# Patient Record
Sex: Female | Born: 1990 | Race: Black or African American | Hispanic: No | Marital: Single | Smoking: Former smoker
Health system: Southern US, Community
[De-identification: ages and names within clinical notes are randomized; demographics above are authoritative.]

## PROBLEM LIST (undated history)

## (undated) ENCOUNTER — Inpatient Hospital Stay (HOSPITAL_COMMUNITY): Payer: Self-pay

## (undated) DIAGNOSIS — R87629 Unspecified abnormal cytological findings in specimens from vagina: Secondary | ICD-10-CM

## (undated) DIAGNOSIS — D649 Anemia, unspecified: Secondary | ICD-10-CM

## (undated) HISTORY — PX: DILATION AND CURETTAGE OF UTERUS: SHX78

## (undated) HISTORY — PX: HERNIA REPAIR: SHX51

## (undated) HISTORY — DX: Unspecified abnormal cytological findings in specimens from vagina: R87.629

---

## 2015-05-05 ENCOUNTER — Emergency Department (HOSPITAL_BASED_OUTPATIENT_CLINIC_OR_DEPARTMENT_OTHER)
Admission: EM | Admit: 2015-05-05 | Discharge: 2015-05-05 | Discharge status: Against Medical Advice | Payer: Medicaid Other | Attending: Dermatology | Admitting: Dermatology

## 2015-05-05 ENCOUNTER — Encounter (HOSPITAL_BASED_OUTPATIENT_CLINIC_OR_DEPARTMENT_OTHER): Payer: Self-pay | Admitting: *Deleted

## 2015-05-05 DIAGNOSIS — R1030 Lower abdominal pain, unspecified: Secondary | ICD-10-CM | POA: Insufficient documentation

## 2015-05-05 LAB — URINALYSIS, ROUTINE W REFLEX MICROSCOPIC
Bilirubin Urine: NEGATIVE
GLUCOSE, UA: NEGATIVE mg/dL
Hgb urine dipstick: NEGATIVE
Ketones, ur: NEGATIVE mg/dL
LEUKOCYTES UA: NEGATIVE
NITRITE: NEGATIVE
PH: 6 (ref 5.0–8.0)
Protein, ur: NEGATIVE mg/dL
SPECIFIC GRAVITY, URINE: 1.026 (ref 1.005–1.030)
Urobilinogen, UA: 1 mg/dL (ref 0.0–1.0)

## 2015-05-05 LAB — PREGNANCY, URINE: Preg Test, Ur: NEGATIVE

## 2015-05-05 NOTE — ED Notes (Signed)
Nurse first-pt to reg desk-states she is leaving-steady gait-NAD

## 2015-05-05 NOTE — ED Notes (Signed)
Pt c/o missed period x 1 day and lower abd pain

## 2015-09-02 ENCOUNTER — Encounter (HOSPITAL_BASED_OUTPATIENT_CLINIC_OR_DEPARTMENT_OTHER): Payer: Self-pay | Admitting: Emergency Medicine

## 2015-09-02 ENCOUNTER — Emergency Department (HOSPITAL_BASED_OUTPATIENT_CLINIC_OR_DEPARTMENT_OTHER): Payer: Medicaid Other

## 2015-09-02 ENCOUNTER — Emergency Department (HOSPITAL_BASED_OUTPATIENT_CLINIC_OR_DEPARTMENT_OTHER)
Admission: EM | Admit: 2015-09-02 | Discharge: 2015-09-02 | Disposition: A | Discharge status: Home / Self Care | Payer: Medicaid Other | Attending: Emergency Medicine | Admitting: Emergency Medicine

## 2015-09-02 DIAGNOSIS — W1839XA Other fall on same level, initial encounter: Secondary | ICD-10-CM | POA: Diagnosis not present

## 2015-09-02 DIAGNOSIS — S8992XA Unspecified injury of left lower leg, initial encounter: Secondary | ICD-10-CM | POA: Diagnosis present

## 2015-09-02 DIAGNOSIS — Y9289 Other specified places as the place of occurrence of the external cause: Secondary | ICD-10-CM | POA: Diagnosis not present

## 2015-09-02 DIAGNOSIS — Y998 Other external cause status: Secondary | ICD-10-CM | POA: Insufficient documentation

## 2015-09-02 DIAGNOSIS — Y9389 Activity, other specified: Secondary | ICD-10-CM | POA: Diagnosis not present

## 2015-09-02 DIAGNOSIS — M25562 Pain in left knee: Secondary | ICD-10-CM

## 2015-09-02 MED ORDER — IBUPROFEN 800 MG PO TABS
800.0000 mg | ORAL_TABLET | Freq: Once | ORAL | Status: AC
  Administered 2015-09-02: 800 mg via ORAL
  Filled 2015-09-02: qty 1

## 2015-09-02 MED ORDER — IBUPROFEN 800 MG PO TABS: 800.0000 mg | ORAL_TABLET | Freq: Three times a day (TID) | ORAL | Status: DC | PRN

## 2015-09-02 NOTE — ED Notes (Signed)
MD at bedside. 

## 2015-09-02 NOTE — ED Notes (Signed)
Patient states that she twisted her left knee about 1 hour ago. No deformity noted, patient has some swelling to her Left knee

## 2015-09-02 NOTE — Discharge Instructions (Signed)
Knee Pain °Knee pain is a very common symptom and can have many causes. Knee pain often goes away when you follow your health care provider's instructions for relieving pain and discomfort at home. However, knee pain can develop into a condition that needs treatment. Some conditions may include: °· Arthritis caused by wear and tear (osteoarthritis). °· Arthritis caused by swelling and irritation (rheumatoid arthritis or gout). °· A cyst or growth in your knee. °· An infection in your knee joint. °· An injury that will not heal. °· Damage, swelling, or irritation of the tissues that support your knee (torn ligaments or tendinitis). °If your knee pain continues, additional tests may be ordered to diagnose your condition. Tests may include X-rays or other imaging studies of your knee. You may also need to have fluid removed from your knee. Treatment for ongoing knee pain depends on the cause, but treatment may include: °· Medicines to relieve pain or swelling. °· Steroid injections in your knee. °· Physical therapy. °· Surgery. °HOME CARE INSTRUCTIONS °· Take medicines only as directed by your health care provider. °· Rest your knee and keep it raised (elevated) while you are resting. °· Do not do things that cause or worsen pain. °· Avoid high-impact activities or exercises, such as running, jumping rope, or doing jumping jacks. °· Apply ice to the knee area: °· Put ice in a plastic bag. °· Place a towel between your skin and the bag. °· Leave the ice on for 20 minutes, 2-3 times a day. °· Ask your health care provider if you should wear an elastic knee support. °· Keep a pillow under your knee when you sleep. °· Lose weight if you are overweight. Extra weight can put pressure on your knee. °· Do not use any tobacco products, including cigarettes, chewing tobacco, or electronic cigarettes. If you need help quitting, ask your health care provider. Smoking may slow the healing of any bone and joint problems that you may  have. °SEEK MEDICAL CARE IF: °· Your knee pain continues, changes, or gets worse. °· You have a fever along with knee pain. °· Your knee buckles or locks up. °· Your knee becomes more swollen. °SEEK IMMEDIATE MEDICAL CARE IF:  °· Your knee joint feels hot to the touch. °· You have chest pain or trouble breathing. °  °This information is not intended to replace advice given to you by your health care provider. Make sure you discuss any questions you have with your health care provider. °  °Document Released: 04/16/2007 Document Revised: 07/10/2014 Document Reviewed: 02/02/2014 °Elsevier Interactive Patient Education ©2016 [employer] ° °How to Use a Knee Brace °A knee brace is a device that you wear to support your knee, especially if the knee is healing after an injury or surgery. There are several types of knee braces. Some are designed to prevent an injury (prophylactic brace). These are often worn during sports. Others support an injured knee (functional brace) or keep it still while it heals (rehabilitative brace). People with severe arthritis of the knee may benefit from a brace that takes some pressure off the knee (unloader brace). Most knee braces are made from a combination of cloth and metal or plastic.  °You may need to wear a knee brace to: °· Relieve knee pain. °· Help your knee support your weight (improve stability). °· Help you walk farther (improve mobility). °· Prevent injury. °· Support your knee while it heals from surgery or from an injury. °RISKS AND COMPLICATIONS °  Generally, knee braces are very safe to wear. However, problems may occur, including:  Skin irritation that may lead to infection.  Making your condition worse if you wear the brace in the wrong way. HOW TO USE A KNEE BRACE Different braces will have different instructions for use. Your health care provider will tell you or show you:  How to put on your brace.  How to adjust the brace.  When and how often to wear the  brace.  How to remove the brace.  If you will need any assistive devices in addition to the brace, such as crutches or a cane. In general, your brace should:  Have the hinge of the brace line up with the bend of your knee.  Have straps, hooks, or tapes that fasten snugly around your leg.  Not feel too tight or too loose. HOW TO CARE FOR A KNEE BRACE  Check your brace often for signs of damage, such as loose connections or attachments. Your knee brace may get damaged or wear out during normal use.  Wash the fabric parts of your brace with soap and water.  Read the insert that comes with your brace for other specific care instructions. SEEK MEDICAL CARE IF:  Your knee brace is too loose or too tight and you cannot adjust it.  Your knee brace causes skin redness, swelling, bruising, or irritation.  Your knee brace is not helping.  Your knee brace is making your knee pain worse.   This information is not intended to replace advice given to you by your health care provider. Make sure you discuss any questions you have with your health care provider.   Document Released: 09/09/2003 Document Revised: 03/10/2015 Document Reviewed: 10/12/2014 Elsevier Interactive Patient Education 2016 [employer] RICE for Routine Care of Injuries Theroutine careofmanyinjuriesincludes rest, ice, compression, and elevation (RICE therapy). RICE therapy is often recommended for injuries to soft tissues, such as a muscle strain, ligament injuries, bruises, and overuse injuries. It can also be used for some bony injuries. Using RICE therapy can help to relieve pain, lessen swelling, and enable your body to heal. Rest Rest is required to allow your body to heal. This usually involves reducing your normal activities and avoiding use of the injured part of your body. Generally, you can return to your normal activities when you are comfortable and have been given permission by your health care  provider. Ice Icing your injury helps to keep the swelling down, and it lessens pain. Do not apply ice directly to your skin.  Put ice in a plastic bag.  Place a towel between your skin and the bag.  Leave the ice on for 20 minutes, 2-3 times a day. Do this for as long as you are directed by your health care provider. Compression Compression means putting pressure on the injured area. Compression helps to keep swelling down, gives support, and helps with discomfort. Compression may be done with an elastic bandage. If an elastic bandage has been applied, follow these general tips:  Remove and reapply the bandage every 3-4 hours or as directed by your health care provider.  Make sure the bandage is not wrapped too tightly, because this can cut off circulation. If part of your body beyond the bandage becomes blue, numb, cold, swollen, or more painful, your bandage is most likely too tight. If this occurs, remove your bandage and reapply it more loosely.  See your health care provider if the bandage seems to be making your  problems worse rather than better. Elevation Elevation means keeping the injured area raised. This helps to lessen swelling and decrease pain. If possible, your injured area should be elevated at or above the level of your heart or the center of your chest. WHEN SHOULD I SEEK MEDICAL CARE? You should seek medical care if:  Your pain and swelling continue.  Your symptoms are getting worse rather than improving. These symptoms may indicate that further evaluation or further X-rays are needed. Sometimes, X-rays may not show a small broken bone (fracture) until a number of days later. Make a follow-up appointment with your health care provider. WHEN SHOULD I SEEK IMMEDIATE MEDICAL CARE? You should seek immediate medical care if:  You have sudden severe pain at or below the area of your injury.  You have redness or increased swelling around your injury.  You have tingling  or numbness at or below the area of your injury that does not improve after you remove the elastic bandage.   This information is not intended to replace advice given to you by your health care provider. Make sure you discuss any questions you have with your health care provider.   Document Released: 10/01/2000 Document Revised: 03/10/2015 Document Reviewed: 05/27/2014 Elsevier Interactive Patient Education [employer]

## 2015-09-02 NOTE — ED Provider Notes (Signed)
TIME SEEN: 1:30 AM  CHIEF COMPLAINT: Left knee pain  HPI: Pt is a 25 y.o. female with no significant past medical history who presents to the emergency department with left knee pain. States that she bent over to plug in her phone charger and twisted her left knee. States that she has noticed some mild swelling since. States pain is worse with full extension and bearing weight. States that her knee buckled causing her to fall to the ground she did not strike her head. Denies neck or back pain, chest or abdominal pain. No numbness, tingling or focal weakness. Has never had problem with this knee before. No history of intra-articular injections or knee surgery. No fever. No history of gout.  ROS: See HPI Constitutional: no fever  Eyes: no drainage  ENT: no runny nose   Cardiovascular:  no chest pain  Resp: no SOB  GI: no vomiting GU: no dysuria Integumentary: no rash  Allergy: no hives  Musculoskeletal: no leg swelling  Neurological: no slurred speech ROS otherwise negative  PAST MEDICAL HISTORY/PAST SURGICAL HISTORY:  History reviewed. No pertinent past medical history.  MEDICATIONS:  Prior to Admission medications   Not on File    ALLERGIES:  No Known Allergies  SOCIAL HISTORY:  Social History  Substance Use Topics  . Smoking status: Never Smoker   . Smokeless tobacco: Not on file  . Alcohol Use: No    FAMILY HISTORY: History reviewed. No pertinent family history.  EXAM: BP 135/78 mmHg  Pulse 85  Temp(Src) 99.5 F (37.5 C) (Oral)  Resp 18  Ht  (1.727 m)  Wt 212 lb (96.163 kg)  BMI 32.24 kg/m2  SpO2 100%  LMP 08/14/2015 CONSTITUTIONAL: Alert and oriented and responds appropriately to questions. Well-appearing; well-nourished HEAD: Normocephalic, atraumatic EYES: Conjunctivae clear, PERRL ENT: normal nose; no rhinorrhea; moist mucous membranes NECK: Supple, no meningismus, no LAD, no midline spinal tenderness or step-off or deformity  CARD: RRR; S1 and S2  appreciated; no murmurs, no clicks, no rubs, no gallops CHEST:  Nontender to palpation without crepitus, ecchymosis or deformity RESP: Normal chest excursion without splinting or tachypnea; breath sounds clear and equal bilaterally; no wheezes, no rhonchi, no rales, no hypoxia or respiratory distress, speaking full sentences ABD/GI: Normal bowel sounds; non-distended; soft, non-tender, no rebound, no guarding, no peritoneal signs BACK:  The back appears normal and is non-tender to palpation, there is no CVA tenderness, no midline spinal tenderness or step-off or deformity EXT: Tender to palpation over the anterior knee with flexion only. She does appear to have some suprapatellar swelling but appears to intact patellar tendon. She does have full range of motion in this knee but does report increased pain with full extension. No ligamentous laxity appreciated. No significant joint effusion. No erythema or warmth. 2+ DP pulse on the left side. No tenderness over the left foot or left ankle or left hip. Normal ROM in all joints; otherwise extremity is are non-tender to palpation; no edema; normal capillary refill; no cyanosis, no calf tenderness or swelling    SKIN: Normal color for age and race; warm; no rash NEURO: Moves all extremities equally, sensation to light touch intact diffusely, cranial nerves II through XII intact PSYCH: The patient's mood and manner are appropriate. Grooming and personal hygiene are appropriate.  MEDICAL DECISION MAKING: Patient here with knee pain. Suspect knee sprain. No ligamentous laxity to suggest anterior cruciate ligament injury. No signs of septic arthritis or gout. No signs of DVT. Neurovascularly intact  distally. X-ray shows no fracture, dislocation or effusion. Have recommended medical management with elevation, ice, rest. We'll provide crutches for pain relief with ambulating. Reports ibuprofen has helped her pain significantly. We'll discharge with prescription for  the same. Have advised her symptoms do not improve in 1 week despite medical management and that she should follow-up with an orthopedic, sports medicine physician. Have provided her with outpatient resources. Discussed return precautions. She verbalized understanding and is comfortable with this plan. We'll place a knee sleeve for comfort.       Layla Maw Ward, DO 09/02/15 [id number]

## 2016-04-02 ENCOUNTER — Encounter (HOSPITAL_COMMUNITY): Payer: Self-pay | Admitting: Emergency Medicine

## 2016-04-02 ENCOUNTER — Emergency Department (HOSPITAL_COMMUNITY)
Admission: EM | Admit: 2016-04-02 | Discharge: 2016-04-03 | Disposition: A | Discharge status: Home / Self Care | Payer: Medicaid Other | Attending: Emergency Medicine | Admitting: Emergency Medicine

## 2016-04-02 DIAGNOSIS — R1032 Left lower quadrant pain: Secondary | ICD-10-CM | POA: Insufficient documentation

## 2016-04-02 DIAGNOSIS — F17200 Nicotine dependence, unspecified, uncomplicated: Secondary | ICD-10-CM | POA: Insufficient documentation

## 2016-04-02 LAB — URINALYSIS, ROUTINE W REFLEX MICROSCOPIC
BILIRUBIN URINE: NEGATIVE
Glucose, UA: NEGATIVE mg/dL
Hgb urine dipstick: NEGATIVE
Ketones, ur: 15 mg/dL — AB
Leukocytes, UA: NEGATIVE
NITRITE: NEGATIVE
PH: 6.5 (ref 5.0–8.0)
Protein, ur: NEGATIVE mg/dL
SPECIFIC GRAVITY, URINE: 1.02 (ref 1.005–1.030)

## 2016-04-02 LAB — CBC WITH DIFFERENTIAL/PLATELET
BASOS ABS: 0 10*3/uL (ref 0.0–0.1)
BASOS PCT: 0 %
EOS ABS: 0.1 10*3/uL (ref 0.0–0.7)
EOS PCT: 2 %
HCT: 38.3 % (ref 36.0–46.0)
Hemoglobin: 12.2 g/dL (ref 12.0–15.0)
Lymphocytes Relative: 49 %
Lymphs Abs: 2.6 10*3/uL (ref 0.7–4.0)
MCH: 25.8 pg — ABNORMAL LOW (ref 26.0–34.0)
MCHC: 31.9 g/dL (ref 30.0–36.0)
MCV: 81 fL (ref 78.0–100.0)
Monocytes Absolute: 0.4 10*3/uL (ref 0.1–1.0)
Monocytes Relative: 6 %
Neutro Abs: 2.3 10*3/uL (ref 1.7–7.7)
Neutrophils Relative %: 43 %
PLATELETS: 236 10*3/uL (ref 150–400)
RBC: 4.73 MIL/uL (ref 3.87–5.11)
RDW: 14.1 % (ref 11.5–15.5)
WBC: 5.4 10*3/uL (ref 4.0–10.5)

## 2016-04-02 LAB — COMPREHENSIVE METABOLIC PANEL
ALT: 13 U/L — AB (ref 14–54)
AST: 15 U/L (ref 15–41)
Albumin: 3.8 g/dL (ref 3.5–5.0)
Alkaline Phosphatase: 41 U/L (ref 38–126)
Anion gap: 9 (ref 5–15)
BUN: 11 mg/dL (ref 6–20)
CHLORIDE: 106 mmol/L (ref 101–111)
CO2: 23 mmol/L (ref 22–32)
CREATININE: 0.96 mg/dL (ref 0.44–1.00)
Calcium: 9.3 mg/dL (ref 8.9–10.3)
GFR calc non Af Amer: >60 mL/min (ref >60)
Glucose, Bld: 97 mg/dL (ref 65–99)
Potassium: 3.6 mmol/L (ref 3.5–5.1)
SODIUM: 138 mmol/L (ref 135–145)
Total Bilirubin: 0.5 mg/dL (ref 0.3–1.2)
Total Protein: 7.1 g/dL (ref 6.5–8.1)

## 2016-04-02 LAB — I-STAT BETA HCG BLOOD, ED (MC, WL, AP ONLY): I-stat hCG, quantitative: <5 m[IU]/mL (ref <5)

## 2016-04-02 NOTE — ED Triage Notes (Signed)
Pt. Reports intermittent LLQ abdominal pain onset 5 days ago , denies nausea ,vomitting or diarrhea , no urinary discomfort or fever .

## 2016-04-02 NOTE — ED Provider Notes (Signed)
[facility]-EMERGENCY DEPT Provider Note   CSN: [id number] Arrival date & time: 04/02/16  2156     History   Chief Complaint Chief Complaint  Patient presents with  . Abdominal Pain    HPI Patty Atkins is a 25 y.o. female.  HPI   25 year old female presents for evaluation of abdominal pain. Patient reports for the past 4 days she has had intermittent left lower quadrant abdominal pain. She described the pain as a cramping contraction-like sensation, mild to moderate in severity lasting for several seconds and is waxing and waning throughout the day. Nothing seems to brought on the pain and no specific treatment tried. She denies having any active pain at this time. She denies having fever, chills, night sweats, chest pain, shortness of breath, nausea, vomiting, diarrhea, constipation, dysuria, vaginal bleeding, vaginal discharge, or rash. She denies any recent strenuous activities or heavy lifting. No history of kidney stones and no history of STD. She denies any new sexual partners. Her last menstrual period was September 4 and she is regular.    History reviewed. No pertinent past medical history.  There are no active problems to display for this patient.   History reviewed. No pertinent surgical history.  OB History    No data available       Home Medications    Prior to Admission medications   Medication Sig Start Date End Date Taking? Authorizing Provider  ibuprofen (ADVIL,MOTRIN) 800 MG tablet Take 1 tablet (800 mg total) by mouth every 8 (eight) hours as needed for mild pain. 09/02/15   Layla Maw Ward, DO    Family History No family history on file.  Social History Social History  Substance Use Topics  . Smoking status: Current Every Day Smoker  . Smokeless tobacco: Never Used  . Alcohol use No     Allergies   Review of patient's allergies indicates no known allergies.   Review of Systems Review of Systems  All other systems reviewed and are  negative.    Physical Exam Updated Vital Signs BP 120/80 (BP Location: Left Arm)   Pulse 72   Temp 98.2 F (36.8 C) (Oral)   Resp 16   Ht 5\' 9"  (1.753 m)   Wt 90.7 kg   LMP 03/06/2016   SpO2 100%   BMI 29.53 kg/m   Physical Exam  Constitutional: She appears well-developed and well-nourished. No distress.  HENT:  Head: Atraumatic.  Eyes: Conjunctivae are normal.  Neck: Neck supple.  Cardiovascular: Normal rate and regular rhythm.   Pulmonary/Chest: Effort normal and breath sounds normal.  Abdominal: Soft. Bowel sounds are normal. She exhibits no distension. There is no tenderness.  Genitourinary:  Genitourinary Comments: No CVA tenderness  Neurological: She is alert.  Skin: No rash noted.  Psychiatric: She has a normal mood and affect.  Nursing note and vitals reviewed.    ED Treatments / Results  Labs (all labs ordered are listed, but only abnormal results are displayed) Labs Reviewed  CBC WITH DIFFERENTIAL/PLATELET - Abnormal; Notable for the following:       Result Value   MCH 25.8 (*)    All other components within normal limits  COMPREHENSIVE METABOLIC PANEL - Abnormal; Notable for the following:    ALT 13 (*)    All other components within normal limits  URINALYSIS, ROUTINE W REFLEX MICROSCOPIC (NOT AT [facility]) - Abnormal; Notable for the following:    Ketones, ur 15 (*)    All other components within normal limits  I-STAT BETA HCG BLOOD, ED ([facility], [facility], [facility] ONLY)    EKG  EKG Interpretation None       Radiology No results found.  Procedures Procedures (including critical care time)  Medications Ordered in ED Medications - No data to display   Initial Impression / Assessment and Plan / ED Course  I have reviewed the triage vital signs and the nursing notes.  Pertinent labs & imaging results that were available during my care of the patient were reviewed by me and considered in my medical decision making (see chart for details).  Clinical Course     BP 124/70 (BP Location: Right Arm)   Pulse 94   Temp 98.2 F (36.8 C) (Oral)   Resp 17   Ht 5\' 9"  (1.753 m)   Wt 90.7 kg   LMP 03/06/2016   SpO2 100%   BMI 29.53 kg/m    Final Clinical Impressions(s) / ED Diagnoses   Final diagnoses:  LLQ pain    New Prescriptions New Prescriptions   No medications on file   12:08 AM Patient presents with left lower quadrant abdominal pain. No change in bowel bladder habits. Abdomen is soft nontender on exam. Her labs are reassuring. Pregnancy test is negative, and UA without evidence of UTI. She denies any new sexual partners and states she have low suspicion for STD. I did discuss option of having a pelvic examination the patient declined. At this time I have low suspicion for any acute emergent medical condition. Patient is stable for discharge. Return precaution discussed.    Fayrene HelperBowie Malaisha Silliman, PA-C 04/03/16 0010    Derwood KaplanAnkit Nanavati, MD 04/03/16 [id number]

## 2016-07-29 ENCOUNTER — Emergency Department (HOSPITAL_COMMUNITY)
Admission: EM | Admit: 2016-07-29 | Discharge: 2016-07-30 | Disposition: A | Discharge status: Home / Self Care | Payer: Medicaid Other | Attending: Emergency Medicine | Admitting: Emergency Medicine

## 2016-07-29 ENCOUNTER — Encounter (HOSPITAL_COMMUNITY): Payer: Self-pay | Admitting: Emergency Medicine

## 2016-07-29 DIAGNOSIS — Z3A08 8 weeks gestation of pregnancy: Secondary | ICD-10-CM | POA: Diagnosis not present

## 2016-07-29 DIAGNOSIS — Z87891 Personal history of nicotine dependence: Secondary | ICD-10-CM | POA: Diagnosis not present

## 2016-07-29 DIAGNOSIS — O26891 Other specified pregnancy related conditions, first trimester: Secondary | ICD-10-CM | POA: Insufficient documentation

## 2016-07-29 DIAGNOSIS — N898 Other specified noninflammatory disorders of vagina: Secondary | ICD-10-CM | POA: Insufficient documentation

## 2016-07-29 DIAGNOSIS — O210 Mild hyperemesis gravidarum: Secondary | ICD-10-CM | POA: Diagnosis present

## 2016-07-29 DIAGNOSIS — Z79899 Other long term (current) drug therapy: Secondary | ICD-10-CM | POA: Diagnosis not present

## 2016-07-29 DIAGNOSIS — R112 Nausea with vomiting, unspecified: Secondary | ICD-10-CM

## 2016-07-29 LAB — URINALYSIS, ROUTINE W REFLEX MICROSCOPIC
Bilirubin Urine: NEGATIVE
GLUCOSE, UA: NEGATIVE mg/dL
HGB URINE DIPSTICK: NEGATIVE
Ketones, ur: NEGATIVE mg/dL
LEUKOCYTES UA: NEGATIVE
NITRITE: NEGATIVE
Protein, ur: NEGATIVE mg/dL
SPECIFIC GRAVITY, URINE: 1.026 (ref 1.005–1.030)
pH: 6 (ref 5.0–8.0)

## 2016-07-29 MED ORDER — METOCLOPRAMIDE HCL 5 MG/ML IJ SOLN
10.0000 mg | Freq: Once | INTRAMUSCULAR | Status: AC
  Administered 2016-07-29: 10 mg via INTRAMUSCULAR
  Filled 2016-07-29: qty 2

## 2016-07-29 MED ORDER — SODIUM CHLORIDE 0.9 % IV BOLUS (SEPSIS)
1000.0000 mL | INTRAVENOUS | Status: AC
Start: 2016-07-29 — End: 2016-07-30
  Administered 2016-07-30: 1000 mL via INTRAVENOUS

## 2016-07-29 NOTE — ED Notes (Signed)
Bed: WA03 Expected date:  Expected time:  Means of arrival:  Comments: Fall, hematoma

## 2016-07-29 NOTE — ED Triage Notes (Signed)
Pt reports that she is about [redacted] weeks pregnant.  She began having severe emesis about 2 weeks ago and is really struggling to hold down and food or drink.

## 2016-07-30 ENCOUNTER — Emergency Department (HOSPITAL_COMMUNITY): Payer: Medicaid Other

## 2016-07-30 LAB — CBC WITH DIFFERENTIAL/PLATELET
BASOS ABS: 0 10*3/uL (ref 0.0–0.1)
Basophils Relative: 0 %
Eosinophils Absolute: 0.2 10*3/uL (ref 0.0–0.7)
Eosinophils Relative: 3 %
HCT: 35.8 % — ABNORMAL LOW (ref 36.0–46.0)
HEMOGLOBIN: 11.8 g/dL — AB (ref 12.0–15.0)
LYMPHS ABS: 2.5 10*3/uL (ref 0.7–4.0)
LYMPHS PCT: 40 %
MCH: 25.7 pg — ABNORMAL LOW (ref 26.0–34.0)
MCHC: 33 g/dL (ref 30.0–36.0)
MCV: 77.8 fL — AB (ref 78.0–100.0)
Monocytes Absolute: 0.4 10*3/uL (ref 0.1–1.0)
Monocytes Relative: 6 %
NEUTROS PCT: 51 %
Neutro Abs: 3.2 10*3/uL (ref 1.7–7.7)
Platelets: 256 10*3/uL (ref 150–400)
RBC: 4.6 MIL/uL (ref 3.87–5.11)
RDW: 14.9 % (ref 11.5–15.5)
WBC: 6.3 10*3/uL (ref 4.0–10.5)

## 2016-07-30 LAB — COMPREHENSIVE METABOLIC PANEL
ALK PHOS: 38 U/L (ref 38–126)
ALT: 14 U/L (ref 14–54)
AST: 15 U/L (ref 15–41)
Albumin: 4 g/dL (ref 3.5–5.0)
Anion gap: 7 (ref 5–15)
BUN: 8 mg/dL (ref 6–20)
CALCIUM: 8.9 mg/dL (ref 8.9–10.3)
CHLORIDE: 102 mmol/L (ref 101–111)
CO2: 24 mmol/L (ref 22–32)
Creatinine, Ser: 0.57 mg/dL (ref 0.44–1.00)
Glucose, Bld: 95 mg/dL (ref 65–99)
Potassium: 3.3 mmol/L — ABNORMAL LOW (ref 3.5–5.1)
SODIUM: 133 mmol/L — AB (ref 135–145)
Total Bilirubin: 0.6 mg/dL (ref 0.3–1.2)
Total Protein: 7.8 g/dL (ref 6.5–8.1)

## 2016-07-30 LAB — WET PREP, GENITAL
SPERM: NONE SEEN
Trich, Wet Prep: NONE SEEN
Yeast Wet Prep HPF POC: NONE SEEN

## 2016-07-30 LAB — HCG, QUANTITATIVE, PREGNANCY: HCG, BETA CHAIN, QUANT, S: 80382 m[IU]/mL — AB (ref <5)

## 2016-07-30 LAB — LIPASE, BLOOD: Lipase: 21 U/L (ref 11–51)

## 2016-07-30 MED ORDER — PRENATAL COMPLETE 14-0.4 MG PO TABS: 2.0000 | ORAL_TABLET | Freq: Every day | ORAL | 0 refills | Status: DC

## 2016-07-30 MED ORDER — METOCLOPRAMIDE HCL 10 MG PO TABS
10.0000 mg | ORAL_TABLET | Freq: Once | ORAL | Status: AC
Start: 2016-07-30 — End: 2016-07-30
  Administered 2016-07-30: 10 mg via ORAL
  Filled 2016-07-30: qty 1

## 2016-07-30 MED ORDER — DOXYLAMINE-PYRIDOXINE 10-10 MG PO TBEC: DELAYED_RELEASE_TABLET | ORAL | 0 refills | Status: DC

## 2016-07-30 NOTE — Discharge Instructions (Signed)
1. Medications: diclegis for nausea and vomiting, usual home medications 2. Treatment: rest, drink plenty of fluids,  3. Follow Up: Please followup with OB/GYN for discussion of your diagnoses and further evaluation after today's visit; if you do not have a primary care doctor use the resource guide provided to find one; Please return to the ER for worsening symptoms

## 2016-07-30 NOTE — ED Provider Notes (Signed)
[facility]-EMERGENCY DEPT Provider Note   CSN: [id number] Arrival date & time: 07/29/16  1820  By signing my name below, I, Javier Docker, attest that this documentation has been prepared under the direction and in the presence of [employer], PA-C. Electronically Signed: Javier Docker, ER Scribe. 02/12/2016. 12:34 AM.  History   Chief Complaint Chief Complaint  Patient presents with  . Emesis   The history is provided by the patient and medical records. No language interpreter was used.    HPI Comments: Patty Atkins is a 26 y.o. female G2P0101 who presents to the Emergency Department complaining of two weeks of emesis with associated nausea. Emesis is nonbloody and nonbilious. She is eight weeks pregnant. She vomited five times today. Over the past two weeks she has vomited 3 times per day on average. She plans to terminate the pregnancy February 2nd. Her LNMP was November 30th. She has some associated abdominal pain when she vomits. She denies vaginal bleeding. She has had one full term birth without complication. Treatments prior to arrival. No aggravating or alleviating factors. She reports that sometimes she is able to keep down food but other times she has not. No associated diarrhea. No sick contacts. She has had no prenatal care.   History reviewed. No pertinent past medical history.  There are no active problems to display for this patient.   History reviewed. No pertinent surgical history.  OB History    Gravida Para Term Preterm AB Living   1             SAB TAB Ectopic Multiple Live Births                   Home Medications    Prior to Admission medications   Medication Sig Start Date End Date Taking? Authorizing Provider  Doxylamine-Pyridoxine 10-10 MG TBEC Take 2 tabs at bedtime. If symptoms are controlled, continue taking 2 tabs at bedtime. If symptoms persist, take 2 tabs at bedtime, then 1 tab in the morning of Day 3 and 2 tabs at bedtime. If  symptoms are controlled on Day 4, continue as scheduled. If symptoms are not controlled, increase dose to 1 tab in the morning, 1 tab midafternoon, and 2 tabs in the evening. Take as scheduled and not on an as needed basis. Max: 4 tabs daily. 07/30/16   Dahlia Client Mendy Chou, PA-C    Family History History reviewed. No pertinent family history.  Social History Social History  Substance Use Topics  . Smoking status: Former Games developer  . Smokeless tobacco: Never Used  . Alcohol use No     Allergies   Patient has no known allergies.   Review of Systems Review of Systems  Gastrointestinal: Positive for abdominal pain ( Lower abdominal cramping), nausea and vomiting.  All other systems reviewed and are negative.    Physical Exam Updated Vital Signs BP 112/58 (BP Location: Left Arm)   Pulse 72   Temp 98.2 F (36.8 C) (Oral)   Resp 20   Ht 5\' 8"  (1.727 m)   Wt 93.4 kg   LMP 06/01/2016   SpO2 100%   BMI 31.32 kg/m   Physical Exam  Constitutional: She appears well-developed and well-nourished. No distress.  Awake, alert, nontoxic appearance  HENT:  Head: Normocephalic and atraumatic.  Mouth/Throat: Oropharynx is clear and moist. No oropharyngeal exudate.  Eyes: Conjunctivae are normal. No scleral icterus.  Neck: Normal range of motion. Neck supple.  Cardiovascular: Normal rate, regular rhythm, normal  heart sounds and intact distal pulses.   No murmur heard. Pulmonary/Chest: Effort normal and breath sounds normal. No respiratory distress. She has no wheezes.  Equal chest expansion  Abdominal: Soft. Bowel sounds are normal. She exhibits no mass. There is no tenderness. There is no rebound and no guarding. Hernia confirmed negative in the right inguinal area and confirmed negative in the left inguinal area.  Genitourinary: Uterus normal. No labial fusion. There is no rash, tenderness or lesion on the right labia. There is no rash, tenderness or lesion on the left labia. Uterus is  not deviated, not enlarged, not fixed and not tender. Cervix exhibits no motion tenderness, no discharge and no friability. Right adnexum displays no mass, no tenderness and no fullness. Left adnexum displays no mass, no tenderness and no fullness. No erythema, tenderness or bleeding in the vagina. No foreign body in the vagina. No signs of injury around the vagina. Vaginal discharge (Minimal) found.  Musculoskeletal: Normal range of motion. She exhibits no edema.  Lymphadenopathy:       Right: No inguinal adenopathy present.       Left: No inguinal adenopathy present.  Neurological: She is alert.  Speech is clear and goal oriented Moves extremities without ataxia  Skin: Skin is warm and dry. She is not diaphoretic. No erythema.  Psychiatric: She has a normal mood and affect.  Nursing note and vitals reviewed.    ED Treatments / Results  DIAGNOSTIC STUDIES: Oxygen Saturation is 100% on RA, normal by my interpretation.    COORDINATION OF CARE: 12:32 AM Discussed treatment plan with pt at bedside and pt agreed to plan.  Labs (all labs ordered are listed, but only abnormal results are displayed) Labs Reviewed  WET PREP, GENITAL - Abnormal; Notable for the following:       Result Value   Clue Cells Wet Prep HPF POC PRESENT (*)    WBC, Wet Prep HPF POC MODERATE (*)    All other components within normal limits  CBC WITH DIFFERENTIAL/PLATELET - Abnormal; Notable for the following:    Hemoglobin 11.8 (*)    HCT 35.8 (*)    MCV 77.8 (*)    MCH 25.7 (*)    All other components within normal limits  COMPREHENSIVE METABOLIC PANEL - Abnormal; Notable for the following:    Sodium 133 (*)    Potassium 3.3 (*)    All other components within normal limits  HCG, QUANTITATIVE, PREGNANCY - Abnormal; Notable for the following:    hCG, Beta Chain, Quant, S 80,382 (*)    All other components within normal limits  LIPASE, BLOOD  URINALYSIS, ROUTINE W REFLEX MICROSCOPIC  GC/CHLAMYDIA PROBE AMP  ([place]) NOT [facility]    Radiology [place] Ob Comp < 14 Wks  Result Date: 07/30/2016 CLINICAL DATA:  Initial evaluation for acute nausea, vomiting. Currently pregnant. EXAM: OBSTETRIC <14 WK US AND TRANSVAGINAL OB US TECHNIQUE: Both transabdominal and transvaginal ultrasound examinations were performed for complete evaluation of the gestation as well as the maternal uterus, adnexal regions, and pelvic cul-de-sac. Transvaginal technique was performed to assess early pregnancy. COMPARISON:  None. FINDINGS: Intrauterine gestational sac: Single Yolk sac:  Present Embryo:  Present Cardiac Activity: Present Heart Rate: 175  bpm MSD:   mm    w     d CRL:  22  mm   8 w   6 d                  [place]  EDC: 03/05/2017 Subchorionic hemorrhage: Small subchorionic hemorrhage without significant mass effect. Maternal uterus/adnexae: Unremarkable. IMPRESSION: 1. Single viable IUP as above. 2. Small subchorionic hemorrhage without mass effect or other complication. Electronically Signed   By: Rise Mu M.D.   On: 07/30/2016 05:27    Procedures Procedures (including critical care time)  Medications Ordered in ED Medications  metoCLOPramide (REGLAN) injection 10 mg (10 mg Intramuscular Given 07/29/16 1914)  sodium chloride 0.9 % bolus 1,000 mL (1,000 mLs Intravenous New Bag/Given 07/30/16 0059)  metoCLOPramide (REGLAN) tablet 10 mg (10 mg Oral Given 07/30/16 0525)     Initial Impression / Assessment and Plan / ED Course  I have reviewed the triage vital signs and the nursing notes.  Pertinent labs & imaging results that were available during my care of the patient were reviewed by me and considered in my medical decision making (see chart for details).     Presents with persistent vomiting for the last 2 weeks. Her pregnancy test positive. Pregnancy test here positive. Ultrasound shows a viable IUP at 8 weeks and 6 days. Patient has tolerated by mouth here without difficulty. Discussed treatment options.  Will give day clear just as this is safe in pregnancy. Patient may return here or in a year if symptoms worsen including hematemesis, worsening abdominal pain or development of vaginal bleeding.  Final Clinical Impressions(s) / ED Diagnoses   Final diagnoses:  Non-intractable vomiting with nausea, unspecified vomiting type  [redacted] weeks gestation of pregnancy    New Prescriptions New Prescriptions   DOXYLAMINE-PYRIDOXINE 10-10 MG TBEC    Take 2 tabs at bedtime. If symptoms are controlled, continue taking 2 tabs at bedtime. If symptoms persist, take 2 tabs at bedtime, then 1 tab in the morning of Day 3 and 2 tabs at bedtime. If symptoms are controlled on Day 4, continue as scheduled. If symptoms are not controlled, increase dose to 1 tab in the morning, 1 tab midafternoon, and 2 tabs in the evening. Take as scheduled and not on an as needed basis. Max: 4 tabs daily.    I personally performed the services described in this documentation, which was scribed in my presence. The recorded information has been reviewed and is accurate.        Dahlia Client Josseline Reddin, PA-C 07/30/16 1610    Loren Racer, MD 07/30/16 Jerene Bears

## 2016-08-01 LAB — GC/CHLAMYDIA PROBE AMP (~~LOC~~) NOT AT ARMC
Chlamydia: NEGATIVE
NEISSERIA GONORRHEA: NEGATIVE

## 2016-11-21 ENCOUNTER — Emergency Department (HOSPITAL_BASED_OUTPATIENT_CLINIC_OR_DEPARTMENT_OTHER)
Admission: EM | Admit: 2016-11-21 | Discharge: 2016-11-21 | Discharge status: Against Medical Advice | Payer: Medicaid Other | Attending: Emergency Medicine | Admitting: Emergency Medicine

## 2016-11-21 ENCOUNTER — Encounter (HOSPITAL_BASED_OUTPATIENT_CLINIC_OR_DEPARTMENT_OTHER): Payer: Self-pay | Admitting: Emergency Medicine

## 2016-11-21 DIAGNOSIS — N760 Acute vaginitis: Secondary | ICD-10-CM | POA: Insufficient documentation

## 2016-11-21 DIAGNOSIS — R1032 Left lower quadrant pain: Secondary | ICD-10-CM

## 2016-11-21 DIAGNOSIS — B9689 Other specified bacterial agents as the cause of diseases classified elsewhere: Secondary | ICD-10-CM

## 2016-11-21 DIAGNOSIS — Z87891 Personal history of nicotine dependence: Secondary | ICD-10-CM | POA: Insufficient documentation

## 2016-11-21 LAB — URINALYSIS, MICROSCOPIC (REFLEX): RBC / HPF: NONE SEEN RBC/hpf (ref 0–5)

## 2016-11-21 LAB — URINALYSIS, ROUTINE W REFLEX MICROSCOPIC
Bilirubin Urine: NEGATIVE
GLUCOSE, UA: NEGATIVE mg/dL
HGB URINE DIPSTICK: NEGATIVE
Ketones, ur: NEGATIVE mg/dL
Nitrite: NEGATIVE
PROTEIN: NEGATIVE mg/dL
SPECIFIC GRAVITY, URINE: 1.018 (ref 1.005–1.030)
pH: 6.5 (ref 5.0–8.0)

## 2016-11-21 LAB — WET PREP, GENITAL
Sperm: NONE SEEN
Trich, Wet Prep: NONE SEEN
Yeast Wet Prep HPF POC: NONE SEEN

## 2016-11-21 LAB — PREGNANCY, URINE: PREG TEST UR: NEGATIVE

## 2016-11-21 NOTE — ED Notes (Signed)
Pt informed that the EDP is coming in shortly to discuss her results

## 2016-11-21 NOTE — ED Triage Notes (Signed)
Pt reports being 7 days late for period. Pt c/o left lower abd pain. Pt reports negative home pregnancy test.

## 2016-11-21 NOTE — ED Notes (Addendum)
Pt reports abnormal bleeding that was described as "pink" and "like implantation bleeding" that came a week early (5/11) and lasted 3 days. Pt states she never did have her normal period. Pt states that is when the LLQ pain started and has persisted.   Pt does not have OB/GYN.

## 2016-11-21 NOTE — ED Provider Notes (Signed)
[facility]   CSN: [id number] Arrival date & time: 11/21/16  1906  By signing my name below, I, Deland PrettySherilynn Knight, attest that this documentation has been prepared under the direction and in the presence of [place] Russo, [place] Electronically Signed: Deland PrettySherilynn Knight, ED Scribe. 11/21/16. 9:33 PM.  History   Chief Complaint Chief Complaint  Patient presents with  . Abdominal Pain    The history is provided by the patient. No language interpreter was used.   HPI Comments: Patty Atkins is a 26 y.o. female who presents to the Emergency Department complaining of constant left lower cramping abdominal pain that began a week ago. Pt reports that her menstrual period is 7 days late, although it's usually regular. She also states that she has had a negative at home pregnancy test that was taken 2 days ago. Pt has associated vaginal (per pt, like implantation) bleeding for 3 days (11/10/2016-11/12/2016) No alleviating factors. She is not on birth control and has not taken any medications for her pain. She reports her last BM was normal and occurred yesterday. Pt denies a PMHx of ovarian cysts and ectopic pregnancy. She also denies abnormal vaginal discharge, nausea, vomiting,  fevers, frequency, urgency, dysuria, difficulty urinating.   History reviewed. No pertinent past medical history.  There are no active problems to display for this patient.   History reviewed. No pertinent surgical history.  OB History    Gravida Para Term Preterm AB Living   1             SAB TAB Ectopic Multiple Live Births                   Home Medications    Prior to Admission medications   Not on File    Family History No family history on file.  Social History Social History  Substance Use Topics  . Smoking status: Former Games developermoker  . Smokeless tobacco: Never Used  . Alcohol use No     Allergies   Patient has no known allergies.   Review of Systems Review of  Systems  Constitutional: Negative for fever.  Gastrointestinal: Positive for abdominal pain. Negative for nausea and vomiting.  Genitourinary: Positive for vaginal bleeding. Negative for difficulty urinating, dysuria, frequency, urgency and vaginal discharge.     Physical Exam Updated Vital Signs BP 129/90 (BP Location: Right Arm)   Pulse 76   Temp 99 F (37.2 C) (Oral)   Resp 16   Ht 5\' 8"  (1.727 m)   Wt 77.1 kg (170 lb)   LMP 10/17/2016 (Exact Date)   SpO2 100%   BMI 25.85 kg/m   Physical Exam  Constitutional: She appears well-developed and well-nourished.  HENT:  Head: Normocephalic and atraumatic.  Eyes: Conjunctivae are normal.  Cardiovascular: Normal rate, regular rhythm, normal heart sounds and intact distal pulses.  Exam reveals no gallop and no friction rub.   No murmur heard. Pulmonary/Chest: Effort normal and breath sounds normal. No respiratory distress. She has no wheezes. She has no rales. She exhibits no tenderness.  Abdominal: Soft. Bowel sounds are normal. She exhibits no distension. There is no tenderness. There is no rebound and no guarding.  Genitourinary: There is no rash or tenderness on the right labia. There is no rash or tenderness on the left labia. Cervix exhibits discharge (white malodorous discharge). Cervix exhibits no motion tenderness and no friability. Right adnexum displays no tenderness. Left adnexum displays no tenderness. No tenderness in the vagina.  Genitourinary Comments: Exam performed with chaperone present.   Psychiatric: She has a normal mood and affect. Her behavior is normal.  Nursing note and vitals reviewed.    ED Treatments / Results   DIAGNOSTIC STUDIES: Oxygen Saturation is 100% on RA, normal by my interpretation.   COORDINATION OF CARE: 9:26 PM-Discussed next steps with pt. Pt verbalized understanding and is agreeable with the plan.   Labs (all labs ordered are listed, but only abnormal results are displayed) Labs  Reviewed  WET PREP, GENITAL - Abnormal; Notable for the following:       Result Value   Clue Cells Wet Prep HPF POC PRESENT (*)    WBC, Wet Prep HPF POC MANY (*)    All other components within normal limits  URINALYSIS, ROUTINE W REFLEX MICROSCOPIC - Abnormal; Notable for the following:    Leukocytes, UA SMALL (*)    All other components within normal limits  URINALYSIS, MICROSCOPIC (REFLEX) - Abnormal; Notable for the following:    Bacteria, UA RARE (*)    Squamous Epithelial / LPF 0-5 (*)    All other components within normal limits  PREGNANCY, URINE  GC/CHLAMYDIA PROBE AMP ([place]) NOT AT [facility]    EKG  EKG Interpretation None       Radiology No results found.  Procedures Procedures (including critical care time)  Medications Ordered in ED Medications - No data to display   Initial Impression / Assessment and Plan / ED Course  I have reviewed the triage vital signs and the nursing notes.  Pertinent labs & imaging results that were available during my care of the patient were reviewed by me and considered in my medical decision making (see chart for details).     Pt w LLQ abdominal cramping and missed menstrual period. No abd tenderness on exam, no CMT or adnexal tenderness, white malodorous discharge noted. Ovarian torsion, ectopic, PID unlikely. Wet prep w clue cells and WBC. Urine preg negative. U/A unremarkable. Plan to tx with flagyl for BV, however pt eloped prior to discussing results and receiving discharge paperwork. Pt afebrile, nontoxic.    Final Clinical Impressions(s) / ED Diagnoses   Final diagnoses:  LLQ abdominal pain  BV (bacterial vaginosis)    New Prescriptions There are no discharge medications for this patient.  I personally performed the services described in this documentation, which was scribed in my presence. The recorded information has been reviewed and is accurate.     Russo, [place], PA-C 11/21/16 2317    Charlynne Pander, MD 11/22/16 [phone]

## 2016-11-21 NOTE — ED Notes (Signed)
EDP and NT at bedside to perform exam

## 2016-11-23 LAB — GC/CHLAMYDIA PROBE AMP (~~LOC~~) NOT AT ARMC
CHLAMYDIA, DNA PROBE: NEGATIVE
NEISSERIA GONORRHEA: NEGATIVE

## 2017-01-24 ENCOUNTER — Inpatient Hospital Stay (HOSPITAL_COMMUNITY): Payer: Medicaid Other

## 2017-01-24 ENCOUNTER — Inpatient Hospital Stay (HOSPITAL_COMMUNITY)
Admission: AD | Admit: 2017-01-24 | Discharge: 2017-01-24 | Disposition: A | Discharge status: Home / Self Care | Payer: Medicaid Other | Source: Ambulatory Visit | Attending: Obstetrics & Gynecology | Admitting: Obstetrics & Gynecology

## 2017-01-24 ENCOUNTER — Encounter (HOSPITAL_COMMUNITY): Payer: Self-pay

## 2017-01-24 DIAGNOSIS — Z87891 Personal history of nicotine dependence: Secondary | ICD-10-CM | POA: Diagnosis not present

## 2017-01-24 DIAGNOSIS — Z6790 Unspecified blood type, Rh positive: Secondary | ICD-10-CM

## 2017-01-24 DIAGNOSIS — O23591 Infection of other part of genital tract in pregnancy, first trimester: Secondary | ICD-10-CM | POA: Insufficient documentation

## 2017-01-24 DIAGNOSIS — N760 Acute vaginitis: Secondary | ICD-10-CM | POA: Diagnosis not present

## 2017-01-24 DIAGNOSIS — Z3491 Encounter for supervision of normal pregnancy, unspecified, first trimester: Secondary | ICD-10-CM

## 2017-01-24 DIAGNOSIS — O26899 Other specified pregnancy related conditions, unspecified trimester: Secondary | ICD-10-CM

## 2017-01-24 DIAGNOSIS — B9689 Other specified bacterial agents as the cause of diseases classified elsewhere: Secondary | ICD-10-CM

## 2017-01-24 DIAGNOSIS — O4691 Antepartum hemorrhage, unspecified, first trimester: Secondary | ICD-10-CM | POA: Diagnosis present

## 2017-01-24 DIAGNOSIS — R109 Unspecified abdominal pain: Secondary | ICD-10-CM | POA: Diagnosis not present

## 2017-01-24 DIAGNOSIS — Z3A01 Less than 8 weeks gestation of pregnancy: Secondary | ICD-10-CM | POA: Diagnosis not present

## 2017-01-24 DIAGNOSIS — O4690 Antepartum hemorrhage, unspecified, unspecified trimester: Secondary | ICD-10-CM | POA: Diagnosis not present

## 2017-01-24 LAB — URINALYSIS, ROUTINE W REFLEX MICROSCOPIC
Bilirubin Urine: NEGATIVE
GLUCOSE, UA: NEGATIVE mg/dL
Hgb urine dipstick: NEGATIVE
KETONES UR: NEGATIVE mg/dL
Nitrite: NEGATIVE
PH: 7 (ref 5.0–8.0)
Protein, ur: NEGATIVE mg/dL
Specific Gravity, Urine: 1.021 (ref 1.005–1.030)

## 2017-01-24 LAB — CBC
HCT: 36 % (ref 36.0–46.0)
HEMOGLOBIN: 12 g/dL (ref 12.0–15.0)
MCH: 25.8 pg — AB (ref 26.0–34.0)
MCHC: 33.3 g/dL (ref 30.0–36.0)
MCV: 77.3 fL — AB (ref 78.0–100.0)
Platelets: 216 10*3/uL (ref 150–400)
RBC: 4.66 MIL/uL (ref 3.87–5.11)
RDW: 17.3 % — ABNORMAL HIGH (ref 11.5–15.5)
WBC: 4.7 10*3/uL (ref 4.0–10.5)

## 2017-01-24 LAB — POCT PREGNANCY, URINE: Preg Test, Ur: POSITIVE — AB

## 2017-01-24 LAB — ABO/RH: ABO/RH(D): A POS

## 2017-01-24 LAB — WET PREP, GENITAL
SPERM: NONE SEEN
TRICH WET PREP: NONE SEEN
Yeast Wet Prep HPF POC: NONE SEEN

## 2017-01-24 LAB — HCG, QUANTITATIVE, PREGNANCY: hCG, Beta Chain, Quant, S: 117609 m[IU]/mL — ABNORMAL HIGH (ref <5)

## 2017-01-24 MED ORDER — METRONIDAZOLE 500 MG PO TABS: 500.0000 mg | ORAL_TABLET | Freq: Two times a day (BID) | ORAL | 0 refills | Status: DC

## 2017-01-24 NOTE — Discharge Instructions (Signed)
Bacterial Vaginosis °Bacterial vaginosis is a vaginal infection that occurs when the normal balance of bacteria in the vagina is disrupted. It results from an overgrowth of certain bacteria. This is the most common vaginal infection among women ages 15-44. °Because bacterial vaginosis increases your risk for STIs (sexually transmitted infections), getting treated can help reduce your risk for chlamydia, gonorrhea, herpes, and HIV (human immunodeficiency virus). Treatment is also important for preventing complications in pregnant women, because this condition can cause an early (premature) delivery. °What are the causes? °This condition is caused by an increase in harmful bacteria that are normally present in small amounts in the vagina. However, the reason that the condition develops is not fully understood. °What increases the risk? °The following factors may make you more likely to develop this condition: °· Having a new sexual partner or multiple sexual partners. °· Having unprotected sex. °· Douching. °· Having an intrauterine device (IUD). °· Smoking. °· Drug and alcohol abuse. °· Taking certain antibiotic medicines. °· Being pregnant. °You cannot get bacterial vaginosis from toilet seats, bedding, swimming pools, or contact with objects around you. °What are the signs or symptoms? °Symptoms of this condition include: °· Grey or white vaginal discharge. The discharge can also be watery or foamy. °· A fish-like odor with discharge, especially after sexual intercourse or during menstruation. °· Itching in and around the vagina. °· Burning or pain with urination. °Some women with bacterial vaginosis have no signs or symptoms. °How is this diagnosed? °This condition is diagnosed based on: °· Your medical history. °· A physical exam of the vagina. °· Testing a sample of vaginal fluid under a microscope to look for a large amount of bad bacteria or abnormal cells. Your health care provider may use a cotton swab or a  small wooden spatula to collect the sample. °How is this treated? °This condition is treated with antibiotics. These may be given as a pill, a vaginal cream, or a medicine that is put into the vagina (suppository). If the condition comes back after treatment, a second round of antibiotics may be needed. °Follow these instructions at home: °Medicines  °· Take over-the-counter and prescription medicines only as told by your health care provider. °· Take or use your antibiotic as told by your health care provider. Do not stop taking or using the antibiotic even if you start to feel better. °General instructions  °· If you have a female sexual partner, tell her that you have a vaginal infection. She should see her health care provider and be treated if she has symptoms. If you have a female sexual partner, he does not need treatment. °· During treatment: °¨ Avoid sexual activity until you finish treatment. °¨ Do not douche. °¨ Avoid alcohol as directed by your health care provider. °¨ Avoid breastfeeding as directed by your health care provider. °· Drink enough water and fluids to keep your urine clear or pale yellow. °· Keep the area around your vagina and rectum clean. °¨ Wash the area daily with warm water. °¨ Wipe yourself from front to back after using the toilet. °· Keep all follow-up visits as told by your health care provider. This is important. °How is this prevented? °· Do not douche. °· Wash the outside of your vagina with warm water only. °· Use protection when having sex. This includes latex condoms and dental dams. °· Limit how many sexual partners you have. To help prevent bacterial vaginosis, it is best to have sex with just one   partner (monogamous). °· Make sure you and your sexual partner are tested for STIs. °· Wear cotton or cotton-lined underwear. °· Avoid wearing tight pants and pantyhose, especially during summer. °· Limit the amount of alcohol that you drink. °· Do not use any products that contain  nicotine or tobacco, such as cigarettes and e-cigarettes. If you need help quitting, ask your health care provider. °· Do not use illegal drugs. °Where to find more information: °· Centers for Disease Control and Prevention: www.cdc.gov/std °· American Sexual Health Association (ASHA): www.ashastd.org °· U.S. Department of Health and Human Services, Office on Women's Health: www.womenshealth.gov/ or https://www.womenshealth.gov/a-z-topics/bacterial-vaginosis °Contact a health care provider if: °· Your symptoms do not improve, even after treatment. °· You have more discharge or pain when urinating. °· You have a fever. °· You have pain in your abdomen. °· You have pain during sex. °· You have vaginal bleeding between periods. °Summary °· Bacterial vaginosis is a vaginal infection that occurs when the normal balance of bacteria in the vagina is disrupted. °· Because bacterial vaginosis increases your risk for STIs (sexually transmitted infections), getting treated can help reduce your risk for chlamydia, gonorrhea, herpes, and HIV (human immunodeficiency virus). Treatment is also important for preventing complications in pregnant women, because the condition can cause an early (premature) delivery. °· This condition is treated with antibiotic medicines. These may be given as a pill, a vaginal cream, or a medicine that is put into the vagina (suppository). °This information is not intended to replace advice given to you by your health care provider. Make sure you discuss any questions you have with your health care provider. °Document Released: 06/19/2005 Document Revised: 03/04/2016 Document Reviewed: 03/04/2016 °Elsevier Interactive Patient Education © 2017 Elsevier Inc. °Subchorionic Hematoma °A subchorionic hematoma is a gathering of blood between the outer wall of the placenta and the inner wall of the womb (uterus). The placenta is the organ that connects the fetus to the wall of the uterus. The placenta performs  the feeding, breathing (oxygen to the fetus), and waste removal (excretory work) of the fetus. °Subchorionic hematoma is the most common abnormality found on a result from ultrasonography done during the first trimester or early second trimester of pregnancy. If there has been little or no vaginal bleeding, early small hematomas usually shrink on their own and do not affect your baby or pregnancy. The blood is gradually absorbed over 1-2 weeks. When bleeding starts later in pregnancy or the hematoma is larger or occurs in an older pregnant woman, the outcome may not be as good. Larger hematomas may get bigger, which increases the chances for miscarriage. Subchorionic hematoma also increases the risk of premature detachment of the placenta from the uterus, preterm (premature) labor, and stillbirth. °Follow these instructions at home: °· Stay on bed rest if your health care provider recommends this. Although bed rest will not prevent more bleeding or prevent a miscarriage, your health care provider may recommend bed rest until you are advised otherwise. °· Avoid heavy lifting (more than 10 lb [4.5 kg]), exercise, sexual intercourse, or douching as directed by your health care provider. °· Keep track of the number of pads you use each day and how soaked (saturated) they are. Write down this information. °· Do not use tampons. °· Keep all follow-up appointments as directed by your health care provider. Your health care provider may ask you to have follow-up blood tests or ultrasound tests or both. °Get help right away if: °· You have severe cramps   in your stomach, back, abdomen, or pelvis. °· You have a fever. °· You pass large clots or tissue. Save any tissue for your health care provider to look at. °· Your bleeding increases or you become lightheaded, feel weak, or have fainting episodes. °This information is not intended to replace advice given to you by your health care provider. Make sure you discuss any questions  you have with your health care provider. °Document Released: 10/04/2006 Document Revised: 11/25/2015 Document Reviewed: 01/16/2013 °Elsevier Interactive Patient Education © 2017 Elsevier Inc. ° °

## 2017-01-24 NOTE — MAU Note (Addendum)
Bleeding since last night. Felt a gush when she stood up to use the bathroom and the put on a panty liner to go back to sleep and only saw a little bit. Took a HPT 5-6 weeks ago and it was positive. Reports some moderate cramping.

## 2017-01-24 NOTE — MAU Provider Note (Signed)
History     CSN: [id number]  Arrival date and time: 01/24/17 1012   First Provider Initiated Contact with Patient 01/24/17 1040      Chief Complaint  Patient presents with  . Vaginal Bleeding   G3P1011 @[redacted]w[redacted]d  by sure LMP here with VB and abd cramping. VB started around 0300 this am, was bright red, and a gush. Bleeding has been minimal since. Lower abdominal cramping started 2-3 weeks ago. Describes as intermittent, bilateral but worse on left, rates 7/10. No urinary sx.     OB History    Gravida Para Term Preterm AB Living   3 1 1   1 1    SAB TAB Ectopic Multiple Live Births     1            Past Medical History:  Diagnosis Date  . Medical history non-contributory     Past Surgical History:  Procedure Laterality Date  . HERNIA REPAIR      Family History  Problem Relation Age of Onset  . Diabetes Maternal Grandmother     Social History  Substance Use Topics  . Smoking status: Former Games developer  . Smokeless tobacco: Never Used  . Alcohol use No    Allergies: No Known Allergies  No prescriptions prior to admission.    Review of Systems  Gastrointestinal: Positive for abdominal pain. Negative for constipation and diarrhea.  Genitourinary: Positive for vaginal bleeding. Negative for dysuria and urgency.   Physical Exam   Blood pressure 125/64, pulse 77, temperature 98.5 F (36.9 C), temperature source Oral, resp. rate 16, height 5\' 7"  (1.702 m), weight 200 lb (90.7 kg), last menstrual period 11/30/2016.  Physical Exam  Constitutional: She is oriented to person, place, and time. She appears well-developed and well-nourished. No distress (appears comfortable).  HENT:  Head: Normocephalic and atraumatic.  Neck: Normal range of motion.  Respiratory: Effort normal. No respiratory distress.  GI: Soft. She exhibits no distension and no mass. There is no tenderness. There is no rebound and no guarding.  Genitourinary:  Genitourinary Comments: External: no lesions  or erythema Vagina: rugated, pink, moist, moderate white frothy discharge, no blood Uterus: non enlarged, anteverted, + tender, no CMT Adnexae: no masses, + tenderness left, + tenderness right   Musculoskeletal: Normal range of motion.  Neurological: She is alert and oriented to person, place, and time.  Skin: Skin is warm and dry.  Psychiatric: She has a normal mood and affect.   Results for orders placed or performed during the hospital encounter of 01/24/17 (from the past 24 hour(s))  Urinalysis, Routine w reflex microscopic     Status: Abnormal   Collection Time: 01/24/17 10:15 AM  Result Value Ref Range   Color, Urine YELLOW YELLOW   APPearance CLOUDY (A) CLEAR   Specific Gravity, Urine 1.021 1.005 - 1.030   pH 7.0 5.0 - 8.0   Glucose, UA NEGATIVE NEGATIVE mg/dL   Hgb urine dipstick NEGATIVE NEGATIVE   Bilirubin Urine NEGATIVE NEGATIVE   Ketones, ur NEGATIVE NEGATIVE mg/dL   Protein, ur NEGATIVE NEGATIVE mg/dL   Nitrite NEGATIVE NEGATIVE   Leukocytes, UA SMALL (A) NEGATIVE   RBC / HPF 0-5 0 - 5 RBC/hpf   WBC, UA 6-30 0 - 5 WBC/hpf   Bacteria, UA RARE (A) NONE SEEN   Squamous Epithelial / LPF 6-30 (A) NONE SEEN   Mucous PRESENT   Pregnancy, urine POC     Status: Abnormal   Collection Time: 01/24/17 10:27 AM  Result Value  Ref Range   Preg Test, Ur POSITIVE (A) NEGATIVE  Wet prep, genital     Status: Abnormal   Collection Time: 01/24/17 10:45 AM  Result Value Ref Range   Yeast Wet Prep HPF POC NONE SEEN NONE SEEN   Trich, Wet Prep NONE SEEN NONE SEEN   Clue Cells Wet Prep HPF POC PRESENT (A) NONE SEEN   WBC, Wet Prep HPF POC MANY (A) NONE SEEN   Sperm NONE SEEN   CBC     Status: Abnormal   Collection Time: 01/24/17 10:59 AM  Result Value Ref Range   WBC 4.7 4.0 - 10.5 K/uL   RBC 4.66 3.87 - 5.11 MIL/uL   Hemoglobin 12.0 12.0 - 15.0 g/dL   HCT 78.236.0 95.636.0 - 21.346.0 %   MCV 77.3 (L) 78.0 - 100.0 fL   MCH 25.8 (L) 26.0 - 34.0 pg   MCHC 33.3 30.0 - 36.0 g/dL   RDW 08.617.3  (H) 57.811.5 - 15.5 %   Platelets 216 150 - 400 K/uL  ABO/Rh     Status: None (Preliminary result)   Collection Time: 01/24/17 10:59 AM  Result Value Ref Range   ABO/RH(D) A POS   hCG, quantitative, pregnancy     Status: Abnormal   Collection Time: 01/24/17 10:59 AM  Result Value Ref Range   hCG, Beta Chain, Quant, S 117,609 (H) <5 mIU/mL   [place] Ob Comp Less 14 Wks  Result Date: 01/24/2017 CLINICAL DATA:  Vaginal bleeding and pelvic cramping beginning today. Gestational age by LMP of 7 weeks 6 days. EXAM: OBSTETRIC <14 WK US AND TRANSVAGINAL OB US TECHNIQUE: Both transabdominal and transvaginal ultrasound examinations were performed for complete evaluation of the gestation as well as the maternal uterus, adnexal regions, and pelvic cul-de-sac. Transvaginal technique was performed to assess early pregnancy. COMPARISON:  None. FINDINGS: Intrauterine gestational sac: Single Yolk sac:  Visualized. Embryo:  Visualized. Cardiac Activity: Visualized. Heart Rate: 157  bpm CRL:  14  mm   7 w   4 d                  US EDC: 09/08/2017 Subchorionic hemorrhage:  Small subchorionic hemorrhage noted. Maternal uterus/adnexae: Normal appearance of left ovary. Right ovary not directly visualized, however no adnexal mass or abnormal free fluid identified. IMPRESSION: Single living IUP measuring 7 weeks 4 days, with US EDC of 09/08/2017. This is concordant with LMP. Small subchorionic hemorrhage noted. Electronically Signed   By: Myles RosenthalJohn  Stahl M.D.   On: 01/24/2017 12:20   [place] Ob Transvaginal  Result Date: 01/24/2017 CLINICAL DATA:  Vaginal bleeding and pelvic cramping beginning today. Gestational age by LMP of 7 weeks 6 days. EXAM: OBSTETRIC <14 WK US AND TRANSVAGINAL OB US TECHNIQUE: Both transabdominal and transvaginal ultrasound examinations were performed for complete evaluation of the gestation as well as the maternal uterus, adnexal regions, and pelvic cul-de-sac. Transvaginal technique was performed to assess early  pregnancy. COMPARISON:  None. FINDINGS: Intrauterine gestational sac: Single Yolk sac:  Visualized. Embryo:  Visualized. Cardiac Activity: Visualized. Heart Rate: 157  bpm CRL:  14  mm   7 w   4 d                  US EDC: 09/08/2017 Subchorionic hemorrhage:  Small subchorionic hemorrhage noted. Maternal uterus/adnexae: Normal appearance of left ovary. Right ovary not directly visualized, however no adnexal mass or abnormal free fluid identified. IMPRESSION: Single living IUP measuring 7 weeks 4 days, with [place]  EDC of 09/08/2017. This is concordant with LMP. Small subchorionic hemorrhage noted. Electronically Signed   By: Myles RosenthalJohn  Stahl M.D.   On: 01/24/2017 12:20    MAU Course  Procedures  MDM Labs and US ordered and reviewed. Normal IUP on US, size consistent with dates. Small SCH present, could be source of spotting. Will treat BV. Stable for discharge home.  Assessment and Plan   1. Normal intrauterine pregnancy on prenatal ultrasound in first trimester   2. Vaginal bleeding in pregnancy   3. Abdominal cramping affecting pregnancy   4. Blood type, Rh positive   5. Bacterial vaginosis    Discharge home Follow up with OB provider of choice in 3 weeks to start care Return precautions  Allergies as of 01/24/2017   No Known Allergies     Medication List    TAKE these medications   acetaminophen 325 MG tablet Commonly known as:  TYLENOL Take 650 mg by mouth every 6 (six) hours as needed for mild pain or headache.   calcium carbonate 500 MG chewable tablet Commonly known as:  TUMS - dosed in mg elemental calcium Chew 2 tablets by mouth 2 (two) times daily as needed for indigestion or heartburn.   Fish Oil 1200 MG Caps Take 1 capsule by mouth daily.   metroNIDAZOLE 500 MG tablet Commonly known as:  FLAGYL Take 1 tablet (500 mg total) by mouth 2 (two) times daily.   prenatal multivitamin Tabs tablet Take 1 tablet by mouth daily at 12 noon.   promethazine 25 MG tablet Commonly known  as:  PHENERGAN Take 25 mg by mouth every 6 (six) hours as needed for nausea or vomiting.      Donette LarryBhambri, Lashawnna Lambrecht, CNM  01/24/2017 1:15 PM

## 2017-01-24 NOTE — Progress Notes (Addendum)
Assumed care of pt from [employer].   Back from U/S  1230: Discharge instruction given with pt understanding. Pt left unit via ambulatory with SO.

## 2017-01-25 LAB — GC/CHLAMYDIA PROBE AMP (~~LOC~~) NOT AT ARMC
Chlamydia: NEGATIVE
NEISSERIA GONORRHEA: NEGATIVE

## 2017-02-05 ENCOUNTER — Other Ambulatory Visit (HOSPITAL_COMMUNITY): Payer: Self-pay | Admitting: Advanced Practice Midwife

## 2017-02-05 ENCOUNTER — Encounter (HOSPITAL_COMMUNITY): Payer: Self-pay | Admitting: *Deleted

## 2017-02-05 ENCOUNTER — Inpatient Hospital Stay (HOSPITAL_COMMUNITY)
Admission: AD | Admit: 2017-02-05 | Discharge: 2017-02-05 | Disposition: A | Discharge status: Home / Self Care | Payer: Medicaid Other | Source: Ambulatory Visit | Attending: Obstetrics & Gynecology | Admitting: Obstetrics & Gynecology

## 2017-02-05 DIAGNOSIS — Z3A09 9 weeks gestation of pregnancy: Secondary | ICD-10-CM | POA: Diagnosis not present

## 2017-02-05 DIAGNOSIS — Z87891 Personal history of nicotine dependence: Secondary | ICD-10-CM | POA: Diagnosis not present

## 2017-02-05 DIAGNOSIS — O210 Mild hyperemesis gravidarum: Secondary | ICD-10-CM | POA: Diagnosis not present

## 2017-02-05 DIAGNOSIS — O219 Vomiting of pregnancy, unspecified: Secondary | ICD-10-CM

## 2017-02-05 LAB — URINALYSIS, ROUTINE W REFLEX MICROSCOPIC
BILIRUBIN URINE: NEGATIVE
GLUCOSE, UA: NEGATIVE mg/dL
HGB URINE DIPSTICK: NEGATIVE
Ketones, ur: NEGATIVE mg/dL
NITRITE: NEGATIVE
PROTEIN: NEGATIVE mg/dL
SPECIFIC GRAVITY, URINE: 1.021 (ref 1.005–1.030)
pH: 8 (ref 5.0–8.0)

## 2017-02-05 MED ORDER — METOCLOPRAMIDE HCL 5 MG PO TABS: 5.0000 mg | ORAL_TABLET | Freq: Four times a day (QID) | ORAL | 1 refills | Status: DC | PRN

## 2017-02-05 MED ORDER — PANTOPRAZOLE SODIUM 20 MG PO TBEC: 20.0000 mg | DELAYED_RELEASE_TABLET | Freq: Every day | ORAL | 1 refills | Status: DC

## 2017-02-05 MED ORDER — GI COCKTAIL ~~LOC~~
30.0000 mL | Freq: Once | ORAL | Status: AC
  Administered 2017-02-05: 30 mL via ORAL
  Filled 2017-02-05: qty 30

## 2017-02-05 MED ORDER — PROMETHAZINE HCL 25 MG/ML IJ SOLN
25.0000 mg | Freq: Once | INTRAMUSCULAR | Status: AC
  Administered 2017-02-05: 25 mg via INTRAMUSCULAR
  Filled 2017-02-05: qty 1

## 2017-02-05 NOTE — Progress Notes (Signed)
Opened in error

## 2017-02-05 NOTE — Discharge Instructions (Signed)
Morning Sickness °Morning sickness is when you feel sick to your stomach (nauseous) during pregnancy. This nauseous feeling may or may not come with vomiting. It often occurs in the morning but can be a problem any time of day. Morning sickness is most common during the first trimester, but it may continue throughout pregnancy. While morning sickness is unpleasant, it is usually harmless unless you develop severe and continual vomiting (hyperemesis gravidarum). This condition requires more intense treatment. °What are the causes? °The cause of morning sickness is not completely known but seems to be related to normal hormonal changes that occur in pregnancy. °What increases the risk? °You are at greater risk if you: °· Experienced nausea or vomiting before your pregnancy. °· Had morning sickness during a previous pregnancy. °· Are pregnant with more than one baby, such as twins. ° °How is this treated? °Do not use any medicines (prescription, over-the-counter, or herbal) for morning sickness without first talking to your health care provider. Your health care provider may prescribe or recommend: °· Vitamin B6 supplements. °· Anti-nausea medicines. °· The herbal medicine ginger. ° °Follow these instructions at home: °· Only take over-the-counter or prescription medicines as directed by your health care provider. °· Taking multivitamins before getting pregnant can prevent or decrease the severity of morning sickness in most women. °· Eat a piece of dry toast or unsalted crackers before getting out of bed in the morning. °· Eat five or six small meals a day. °· Eat dry and bland foods (rice, baked potato). Foods high in carbohydrates are often helpful. °· Do not drink liquids with your meals. Drink liquids between meals. °· Avoid greasy, fatty, and spicy foods. °· Get someone to cook for you if the smell of any food causes nausea and vomiting. °· If you feel nauseous after taking prenatal vitamins, take the vitamins at  night or with a snack. °· Snack on protein foods (nuts, yogurt, cheese) between meals if you are hungry. °· Eat unsweetened gelatins for desserts. °· Wearing an acupressure wristband (worn for sea sickness) may be helpful. °· Acupuncture may be helpful. °· Do not smoke. °· Get a humidifier to keep the air in your house free of odors. °· Get plenty of fresh air. °Contact a health care provider if: °· Your home remedies are not working, and you need medicine. °· You feel dizzy or lightheaded. °· You are losing weight. °Get help right away if: °· You have persistent and uncontrolled nausea and vomiting. °· You pass out (faint). °This information is not intended to replace advice given to you by your health care provider. Make sure you discuss any questions you have with your health care provider. °Document Released: 08/10/2006 Document Revised: 11/25/2015 Document Reviewed: 12/04/2012 °Elsevier Interactive Patient Education © 2017 Else[employer] Inc. ° °

## 2017-02-05 NOTE — MAU Note (Signed)
Patient states she has been on phenergan, but has been unable to keep that down.  Has vomited 3 times in past 24 hours.  Last kept down orange juice an hour ago.  Denies diarrhea.

## 2017-02-05 NOTE — MAU Provider Note (Signed)
History    CSN: [id number]  Arrival date and time: 02/05/17 1053   First Provider Initiated Contact with Patient 02/05/17 1125     Chief Complaint  Patient presents with  . Nausea  . Emesis During Pregnancy   HPI Patty Atkins is a 26 y.o. G3P1011 at 7333w4d who presents with nausea and vomiting. She states for the last week she has not been able to keep any food or drink down. She states she has vomited 3 times in the last 24 hours, able to drink orange juice this am. She has tried phenergan but states she doesn't think it works. She denies any pain, vaginal bleeding or discharge.   OB History    Gravida Para Term Preterm AB Living   3 1 1   1 1    SAB TAB Ectopic Multiple Live Births     1            Past Medical History:  Diagnosis Date  . Medical history non-contributory     Past Surgical History:  Procedure Laterality Date  . DILATION AND CURETTAGE OF UTERUS    . HERNIA REPAIR      Family History  Problem Relation Age of Onset  . Diabetes Maternal Grandmother     Social History  Substance Use Topics  . Smoking status: Former Games developermoker  . Smokeless tobacco: Never Used  . Alcohol use No    Allergies: No Known Allergies  Prescriptions Prior to Admission  Medication Sig Dispense Refill Last Dose  . acetaminophen (TYLENOL) 325 MG tablet Take 650 mg by mouth every 6 (six) hours as needed for mild pain or headache.   01/21/2017  . calcium carbonate (TUMS - DOSED IN MG ELEMENTAL CALCIUM) 500 MG chewable tablet Chew 2 tablets by mouth 2 (two) times daily as needed for indigestion or heartburn.   01/23/2017 at Unknown time  . metroNIDAZOLE (FLAGYL) 500 MG tablet Take 1 tablet (500 mg total) by mouth 2 (two) times daily. 14 tablet 0   . Omega-3 Fatty Acids (FISH OIL) 1200 MG CAPS Take 1 capsule by mouth daily.   01/23/2017 at Unknown time  . Prenatal Vit-Fe Fumarate-FA (PRENATAL MULTIVITAMIN) TABS tablet Take 1 tablet by mouth daily at 12 noon.   01/24/2017 at Unknown time  .  promethazine (PHENERGAN) 25 MG tablet Take 25 mg by mouth every 6 (six) hours as needed for nausea or vomiting.   01/24/2017 at Unknown time    Review of Systems  Constitutional: Negative.  Negative for chills and fever.  HENT: Negative.   Respiratory: Negative.  Negative for shortness of breath.   Cardiovascular: Negative.  Negative for chest pain.  Gastrointestinal: Positive for nausea and vomiting. Negative for abdominal pain, constipation and diarrhea.  Genitourinary: Negative.  Negative for dysuria, vaginal bleeding and vaginal discharge.  Neurological: Negative.  Negative for dizziness and headaches.  Psychiatric/Behavioral: Negative.    Physical Exam   Blood pressure 117/68, pulse 75, temperature 98.9 F (37.2 C), temperature source Oral, resp. rate 18, last menstrual period 11/30/2016, SpO2 100 %.  Physical Exam  Nursing note and vitals reviewed. Constitutional: She is oriented to person, place, and time. She appears well-developed and well-nourished.  HENT:  Head: Normocephalic and atraumatic.  Eyes: Conjunctivae are normal. No scleral icterus.  Cardiovascular: Normal rate, regular rhythm and normal heart sounds.   Respiratory: Effort normal and breath sounds normal. No respiratory distress.  GI: Soft. She exhibits no distension. There is no tenderness.  Neurological: She is  alert and oriented to person, place, and time.  Skin: Skin is warm and dry.  Psychiatric: She has a normal mood and affect. Her behavior is normal. Judgment and thought content normal.    MAU Course  Procedures Results for orders placed or performed during the hospital encounter of 02/05/17 (from the past 24 hour(s))  Urinalysis, Routine w reflex microscopic     Status: Abnormal   Collection Time: 02/05/17 11:00 AM  Result Value Ref Range   Color, Urine YELLOW YELLOW   APPearance CLOUDY (A) CLEAR   Specific Gravity, Urine 1.021 1.005 - 1.030   pH 8.0 5.0 - 8.0   Glucose, UA NEGATIVE NEGATIVE  mg/dL   Hgb urine dipstick NEGATIVE NEGATIVE   Bilirubin Urine NEGATIVE NEGATIVE   Ketones, ur NEGATIVE NEGATIVE mg/dL   Protein, ur NEGATIVE NEGATIVE mg/dL   Nitrite NEGATIVE NEGATIVE   Leukocytes, UA SMALL (A) NEGATIVE   RBC / HPF 0-5 0 - 5 RBC/hpf   WBC, UA 6-30 0 - 5 WBC/hpf   Bacteria, UA RARE (A) NONE SEEN   Squamous Epithelial / LPF 6-30 (A) NONE SEEN   Mucous PRESENT    MDM UA 25mg  Phenergan IM GI cocktail PO Challenge- patient reports relief and able to keep down crackers and juice No episodes of vomiting while in MAU Assessment and Plan   1. Nausea and vomiting during pregnancy prior to [redacted] weeks gestation   2. [redacted] weeks gestation of pregnancy    -Discharge patient home in stable condition -Prescriptions for reglan and protonix given to patient, continue phenergan prn -Follow up at [facility] as scheduled for prenatal care -Encouraged to return here or to other Urgent Care/ED if she develops worsening of symptoms, increase in pain, fever, or other concerning symptoms.   Cleone Slim SNM 02/05/2017, 12:52 PM   I confirm that I have verified the information documented in the SNM's note and that I have also personally reperformed the physical exam and all medical decision making activities.  Will try adding reglan to regimen due to phenergan making her sleepy Will also add protonix Aviva Signs, CNM

## 2017-03-06 ENCOUNTER — Ambulatory Visit (INDEPENDENT_AMBULATORY_CARE_PROVIDER_SITE_OTHER): Payer: Medicaid Other | Admitting: Obstetrics

## 2017-03-06 ENCOUNTER — Other Ambulatory Visit (HOSPITAL_COMMUNITY)
Admission: RE | Admit: 2017-03-06 | Discharge: 2017-03-06 | Disposition: A | Discharge status: Home / Self Care | Payer: Medicaid Other | Source: Ambulatory Visit | Attending: Obstetrics | Admitting: Obstetrics

## 2017-03-06 ENCOUNTER — Encounter: Payer: Self-pay | Admitting: Obstetrics

## 2017-03-06 VITALS — BP 125/81 | HR 93 | Wt 207.0 lb

## 2017-03-06 DIAGNOSIS — Z3480 Encounter for supervision of other normal pregnancy, unspecified trimester: Secondary | ICD-10-CM

## 2017-03-06 DIAGNOSIS — Z01411 Encounter for gynecological examination (general) (routine) with abnormal findings: Secondary | ICD-10-CM | POA: Insufficient documentation

## 2017-03-06 DIAGNOSIS — Z3482 Encounter for supervision of other normal pregnancy, second trimester: Secondary | ICD-10-CM | POA: Diagnosis not present

## 2017-03-06 NOTE — Progress Notes (Signed)
Patient confirmed pregnancy at health dept. [facility] she was also seen for early spotting. She states every thing was fine. Nausea is still there it is some better, but not gone.

## 2017-03-06 NOTE — Progress Notes (Signed)
Subjective:  Patty Atkins is a 26 y.o. G3P1011 at 8318w5d being seen today for ongoing prenatal care.  She is currently monitored for the following issues for this low-risk pregnancy and has Supervision of other normal pregnancy, antepartum on her problem list.  Patient reports nausea.  Contractions: Not present. Vag. Bleeding: None.   . Denies leaking of fluid.   The following portions of the patient's history were reviewed and updated as appropriate: allergies, current medications, past family history, past medical history, past social history, past surgical history and problem list. Problem list updated.  Objective:   Vitals:   03/06/17 0920  BP: 125/81  Pulse: 93  Weight: 207 lb (93.9 kg)    Fetal Status: Fetal Heart Rate (bpm): 150         General:  Alert, oriented and cooperative. Patient is in no acute distress.  Skin: Skin is warm and dry. No rash noted.   Cardiovascular: Normal heart rate noted  Respiratory: Normal respiratory effort, no problems with respiration noted  Abdomen: Soft, gravid, appropriate for gestational age. Pain/Pressure: Absent     Pelvic:  Cervical exam deferred        Extremities: Normal range of motion.  Edema: None  Mental Status: Normal mood and affect. Normal behavior. Normal judgment and thought content.   Urinalysis:      Assessment and Plan:  Pregnancy: G3P1011 at 618w5d  1. Supervision of other normal pregnancy, antepartum Rx: - Hemoglobinopathy evaluation - HIV antibody - Varicella zoster antibody, IgG - VITAMIN D 25 Hydroxy (Vit-D Deficiency, Fractures) - Culture, OB Urine - Prenatal Profile I - Cytology - PAP - Babyscripts Schedule Optimization - US MFM OB COMP + 14 WK; Future  Preterm labor symptoms and general obstetric precautions including but not limited to vaginal bleeding, contractions, leaking of fluid and fetal movement were reviewed in detail with the patient. Please refer to After Visit Summary for other counseling  recommendations.  Return in about 2 weeks (around 03/20/2017) for Babyscripts, ROB.  Quad Screen.   Brock BadHarper, Charles A, MD

## 2017-03-08 LAB — CULTURE, OB URINE

## 2017-03-08 LAB — CYTOLOGY - PAP: Diagnosis: NEGATIVE

## 2017-03-08 LAB — URINE CULTURE, OB REFLEX

## 2017-03-13 ENCOUNTER — Other Ambulatory Visit: Payer: Self-pay | Admitting: Obstetrics

## 2017-03-13 DIAGNOSIS — Z3480 Encounter for supervision of other normal pregnancy, unspecified trimester: Secondary | ICD-10-CM

## 2017-03-13 DIAGNOSIS — E559 Vitamin D deficiency, unspecified: Secondary | ICD-10-CM

## 2017-03-13 LAB — PRENATAL PROFILE I(LABCORP)
BASOS ABS: 0 10*3/uL (ref 0.0–0.2)
Basos: 0 %
EOS (ABSOLUTE): 0.1 10*3/uL (ref 0.0–0.4)
Eos: 1 %
HEMATOCRIT: 38.5 % (ref 34.0–46.6)
HEMOGLOBIN: 12.4 g/dL (ref 11.1–15.9)
HEP B S AG: NEGATIVE
IMMATURE GRANS (ABS): 0 10*3/uL (ref 0.0–0.1)
IMMATURE GRANULOCYTES: 0 %
LYMPHS ABS: 1.4 10*3/uL (ref 0.7–3.1)
LYMPHS: 30 %
MCH: 25.5 pg — ABNORMAL LOW (ref 26.6–33.0)
MCHC: 32.2 g/dL (ref 31.5–35.7)
MCV: 79 fL (ref 79–97)
MONOS ABS: 0.3 10*3/uL (ref 0.1–0.9)
Monocytes: 7 %
NEUTROS PCT: 62 %
Neutrophils Absolute: 2.9 10*3/uL (ref 1.4–7.0)
PLATELETS: 263 10*3/uL (ref 150–379)
RBC: 4.86 x10E6/uL (ref 3.77–5.28)
RDW: 18.1 % — AB (ref 12.3–15.4)
RPR: NONREACTIVE
Rh Factor: POSITIVE
Rubella Antibodies, IGG: 2.57 index (ref >0.99)
WBC: 4.7 10*3/uL (ref 3.4–10.8)

## 2017-03-13 LAB — VARICELLA ZOSTER ANTIBODY, IGG: VARICELLA: 754 {index} (ref >165)

## 2017-03-13 LAB — HEMOGLOBINOPATHY EVALUATION
HEMOGLOBIN F QUANTITATION: 0.8 % (ref 0.0–2.0)
HGB C: 0 %
HGB S: 0 %
HGB VARIANT: 0 %
Hemoglobin A2 Quantitation: 2.3 % (ref 1.8–3.2)
Hgb A: 96.9 % (ref 96.4–98.8)

## 2017-03-13 LAB — HIV ANTIBODY (ROUTINE TESTING W REFLEX): HIV Screen 4th Generation wRfx: NONREACTIVE

## 2017-03-13 LAB — VITAMIN D 25 HYDROXY (VIT D DEFICIENCY, FRACTURES): Vit D, 25-Hydroxy: 19.5 ng/mL — ABNORMAL LOW (ref 30.0–100.0)

## 2017-03-13 LAB — AB SCR+ANTIBODY ID: Antibody Screen: POSITIVE — AB

## 2017-03-13 MED ORDER — VITAMIN D 50 MCG (2000 UT) PO CAPS: 1.0000 | ORAL_CAPSULE | Freq: Every day | ORAL | 5 refills | Status: DC

## 2017-03-20 ENCOUNTER — Other Ambulatory Visit: Payer: Medicaid Other

## 2017-04-04 ENCOUNTER — Encounter (HOSPITAL_COMMUNITY): Payer: Self-pay | Admitting: Obstetrics

## 2017-04-12 ENCOUNTER — Other Ambulatory Visit: Payer: Self-pay | Admitting: Obstetrics

## 2017-04-12 ENCOUNTER — Ambulatory Visit (HOSPITAL_COMMUNITY)
Admission: RE | Admit: 2017-04-12 | Discharge: 2017-04-12 | Disposition: A | Discharge status: Home / Self Care | Payer: Medicaid Other | Source: Ambulatory Visit | Attending: Obstetrics | Admitting: Obstetrics

## 2017-04-12 DIAGNOSIS — Z3A19 19 weeks gestation of pregnancy: Secondary | ICD-10-CM | POA: Diagnosis not present

## 2017-04-12 DIAGNOSIS — Z3689 Encounter for other specified antenatal screening: Secondary | ICD-10-CM | POA: Diagnosis present

## 2017-04-12 DIAGNOSIS — Z3480 Encounter for supervision of other normal pregnancy, unspecified trimester: Secondary | ICD-10-CM

## 2017-04-19 ENCOUNTER — Encounter: Payer: Medicaid Other | Admitting: Obstetrics

## 2017-04-25 ENCOUNTER — Encounter: Payer: Medicaid Other | Admitting: Obstetrics

## 2017-05-02 ENCOUNTER — Encounter: Payer: Medicaid Other | Admitting: Obstetrics

## 2017-05-14 ENCOUNTER — Encounter: Payer: Self-pay | Admitting: Obstetrics

## 2017-05-14 ENCOUNTER — Other Ambulatory Visit (HOSPITAL_COMMUNITY)
Admission: RE | Admit: 2017-05-14 | Discharge: 2017-05-14 | Disposition: A | Discharge status: Home / Self Care | Payer: Medicaid Other | Source: Ambulatory Visit | Attending: Obstetrics | Admitting: Obstetrics

## 2017-05-14 ENCOUNTER — Other Ambulatory Visit: Payer: Self-pay

## 2017-05-14 ENCOUNTER — Ambulatory Visit (INDEPENDENT_AMBULATORY_CARE_PROVIDER_SITE_OTHER): Payer: Medicaid Other | Admitting: Obstetrics

## 2017-05-14 VITALS — BP 132/76 | HR 88 | Wt 219.3 lb

## 2017-05-14 DIAGNOSIS — N898 Other specified noninflammatory disorders of vagina: Secondary | ICD-10-CM

## 2017-05-14 DIAGNOSIS — R300 Dysuria: Secondary | ICD-10-CM

## 2017-05-14 DIAGNOSIS — O26892 Other specified pregnancy related conditions, second trimester: Secondary | ICD-10-CM | POA: Insufficient documentation

## 2017-05-14 DIAGNOSIS — Z23 Encounter for immunization: Secondary | ICD-10-CM

## 2017-05-14 DIAGNOSIS — Z3482 Encounter for supervision of other normal pregnancy, second trimester: Secondary | ICD-10-CM

## 2017-05-14 DIAGNOSIS — Z3480 Encounter for supervision of other normal pregnancy, unspecified trimester: Secondary | ICD-10-CM

## 2017-05-14 NOTE — Addendum Note (Signed)
Addended by: Areta HaberMOREHEAD, Dallan Schonberg B on: 05/14/2017 12:02 PM   Modules accepted: Orders

## 2017-05-14 NOTE — Patient Instructions (Signed)

## 2017-05-14 NOTE — Addendum Note (Signed)
Addended by: Dalphine HandingGARDNER, Shaney Deckman L on: 05/14/2017 04:47 PM   Modules accepted: Orders

## 2017-05-14 NOTE — Progress Notes (Signed)
Subjective:  Patty BihariCurnisha Atkins is a 26 y.o. G3P1011 at 3465w4d being seen today for ongoing prenatal care.  She is currently monitored for the following issues for this low-risk pregnancy and has Supervision of other normal pregnancy, antepartum on their problem list.  Patient reports backache, vaginal irritation and cramping.  Contractions: Irritability. Vag. Bleeding: None.  Movement: Present. Denies leaking of fluid.   The following portions of the patient's history were reviewed and updated as appropriate: allergies, current medications, past family history, past medical history, past social history, past surgical history and problem list. Problem list updated.  Objective:   Vitals:   05/14/17 0946  BP: 132/76  Pulse: 88  Weight: 219 lb 4.8 oz (99.5 kg)    Fetal Status: Fetal Heart Rate (bpm): 150   Movement: Present     General:  Alert, oriented and cooperative. Patient is in no acute distress.  Skin: Skin is warm and dry. No rash noted.   Cardiovascular: Normal heart rate noted  Respiratory: Normal respiratory effort, no problems with respiration noted  Abdomen: Soft, gravid, appropriate for gestational age. Pain/Pressure: Present     Pelvic:  SSE:  Grayish, thin malodorous discharge.  Cervix long and closed.   Extremities: Normal range of motion.  Edema: None  Mental Status: Normal mood and affect. Normal behavior. Normal judgment and thought content.   Urinalysis:      Assessment and Plan:  Pregnancy: G3P1011 at 7465w4d  1. Supervision of other normal pregnancy, antepartum Rx: - Flu Vaccine QUAD 36+ mos IM (Fluarix, Quad PF)  2. Need for immunization against influenza Rx: - Flu Vaccine QUAD 36+ mos IM  Preterm labor symptoms and general obstetric precautions including but not limited to vaginal bleeding, contractions, leaking of fluid and fetal movement were reviewed in detail with the patient. Please refer to After Visit Summary for other counseling recommendations.   Return in about 4 weeks (around 06/11/2017) for ROB, 2 hour OGTT.   Brock BadHarper, Yesha Muchow A, MD

## 2017-05-14 NOTE — Progress Notes (Signed)
FLU given in left deltoid. Cramping and LOF x 14 days. Burning x 7 days, NV, back pain, pinkish discharge .

## 2017-05-15 ENCOUNTER — Other Ambulatory Visit: Payer: Self-pay | Admitting: Obstetrics

## 2017-05-15 DIAGNOSIS — B373 Candidiasis of vulva and vagina: Secondary | ICD-10-CM

## 2017-05-15 DIAGNOSIS — B3731 Acute candidiasis of vulva and vagina: Secondary | ICD-10-CM

## 2017-05-15 LAB — CERVICOVAGINAL ANCILLARY ONLY
BACTERIAL VAGINITIS: NEGATIVE
CANDIDA VAGINITIS: POSITIVE — AB
CHLAMYDIA, DNA PROBE: NEGATIVE
Neisseria Gonorrhea: NEGATIVE
Trichomonas: NEGATIVE

## 2017-05-15 MED ORDER — FLUCONAZOLE 150 MG PO TABS: 150.0000 mg | ORAL_TABLET | Freq: Once | ORAL | 0 refills | Status: AC

## 2017-05-16 LAB — URINE CULTURE

## 2017-06-11 ENCOUNTER — Encounter: Payer: Medicaid Other | Admitting: Obstetrics

## 2017-06-18 ENCOUNTER — Ambulatory Visit (INDEPENDENT_AMBULATORY_CARE_PROVIDER_SITE_OTHER): Payer: Medicaid Other | Admitting: Obstetrics

## 2017-06-18 ENCOUNTER — Other Ambulatory Visit: Payer: Medicaid Other

## 2017-06-18 ENCOUNTER — Encounter: Payer: Self-pay | Admitting: Obstetrics

## 2017-06-18 VITALS — BP 121/69 | HR 79 | Wt 224.7 lb

## 2017-06-18 DIAGNOSIS — Z23 Encounter for immunization: Secondary | ICD-10-CM

## 2017-06-18 DIAGNOSIS — Z3480 Encounter for supervision of other normal pregnancy, unspecified trimester: Secondary | ICD-10-CM

## 2017-06-18 DIAGNOSIS — K219 Gastro-esophageal reflux disease without esophagitis: Secondary | ICD-10-CM

## 2017-06-18 DIAGNOSIS — Z3483 Encounter for supervision of other normal pregnancy, third trimester: Secondary | ICD-10-CM

## 2017-06-18 MED ORDER — OMEPRAZOLE 20 MG PO CPDR: 20.0000 mg | DELAYED_RELEASE_CAPSULE | Freq: Two times a day (BID) | ORAL | 5 refills | Status: DC

## 2017-06-18 NOTE — Addendum Note (Signed)
Addended by: Brock BadHARPER, Aalliyah Kilker A on: 06/18/2017 03:34 PM   Modules accepted: Orders

## 2017-06-18 NOTE — Progress Notes (Signed)
Per pt anatomy scan note on 04/12/17  pt was to have 4wk F/U U/S to recheck growthand complete anatomic survey. Pt states she has not been R/S for her scan. Pt is concerned about baby's growth.

## 2017-06-18 NOTE — Progress Notes (Addendum)
Subjective:  Patty Atkins is a 26 y.o. G3P1011 at 2949w4d being seen today for ongoing prenatal care.  She is currently monitored for the following issues for this low-risk pregnancy and has Supervision of other normal pregnancy, antepartum on their problem list.  Patient reports Heartburn.  Contractions: Not present. Vag. Bleeding: None.  Movement: Present. Denies leaking of fluid.   The following portions of the patient's history were reviewed and updated as appropriate: allergies, current medications, past family history, past medical history, past social history, past surgical history and problem list. Problem list updated.  Objective:   Vitals:   06/18/17 0930  BP: 121/69  Pulse: 79  Weight: 224 lb 11.2 oz (101.9 kg)    Fetal Status: Fetal Heart Rate (bpm): 150   Movement: Present     General:  Alert, oriented and cooperative. Patient is in no acute distress.  Skin: Skin is warm and dry. No rash noted.   Cardiovascular: Normal heart rate noted  Respiratory: Normal respiratory effort, no problems with respiration noted  Abdomen: Soft, gravid, appropriate for gestational age. Pain/Pressure: Present     Pelvic:  Cervical exam deferred        Extremities: Normal range of motion.  Edema: None  Mental Status: Normal mood and affect. Normal behavior. Normal judgment and thought content.   Urinalysis:      Assessment and Plan:  Pregnancy: G3P1011 at 6549w4d  1. Supervision of other normal pregnancy, antepartum Rx: - Glucose Tolerance, 2 Hours w/1 Hour - CBC - RPR - HIV antibody  2. Immunization due Rx: - Tdap vaccine greater than or equal to 7yo IM  3. GERD without esophagitis Rx: - omeprazole (PRILOSEC) 20 MG capsule; Take 1 capsule (20 mg total) by mouth 2 (two) times daily before a meal.  Dispense: 60 capsule; Refill: 5  Preterm labor symptoms and general obstetric precautions including but not limited to vaginal bleeding, contractions, leaking of fluid and fetal  movement were reviewed in detail with the patient. Please refer to After Visit Summary for other counseling recommendations.  Return in about 2 weeks (around 07/02/2017) for ROB.   Brock BadHarper, Rashaun Wichert A, MD

## 2017-06-19 ENCOUNTER — Telehealth: Payer: Self-pay | Admitting: Pediatrics

## 2017-06-19 LAB — GLUCOSE TOLERANCE, 2 HOURS W/ 1HR
GLUCOSE, 1 HOUR: 107 mg/dL (ref 65–179)
GLUCOSE, FASTING: 82 mg/dL (ref 65–91)
Glucose, 2 hour: 94 mg/dL (ref 65–152)

## 2017-06-19 LAB — CBC
Hematocrit: 32.2 % — ABNORMAL LOW (ref 34.0–46.6)
Hemoglobin: 10.3 g/dL — ABNORMAL LOW (ref 11.1–15.9)
MCH: 26.1 pg — ABNORMAL LOW (ref 26.6–33.0)
MCHC: 32 g/dL (ref 31.5–35.7)
MCV: 82 fL (ref 79–97)
PLATELETS: 223 10*3/uL (ref 150–379)
RBC: 3.95 x10E6/uL (ref 3.77–5.28)
RDW: 15.4 % (ref 12.3–15.4)
WBC: 6.2 10*3/uL (ref 3.4–10.8)

## 2017-06-19 LAB — RPR: RPR: NONREACTIVE

## 2017-06-19 LAB — HIV ANTIBODY (ROUTINE TESTING W REFLEX): HIV Screen 4th Generation wRfx: NONREACTIVE

## 2017-06-19 NOTE — Telephone Encounter (Signed)
Pt left message on nurse line stating rx for heartburn not sent to pharmacy.  Per chart omeprazole sent 06/18/17.  I LMTCB on pt's voicemail if further assistance is needed.

## 2017-06-20 ENCOUNTER — Other Ambulatory Visit: Payer: Self-pay | Admitting: Obstetrics

## 2017-06-20 ENCOUNTER — Encounter: Payer: Self-pay | Admitting: *Deleted

## 2017-06-20 DIAGNOSIS — D509 Iron deficiency anemia, unspecified: Secondary | ICD-10-CM

## 2017-06-20 MED ORDER — FERROUS SULFATE 325 (65 FE) MG PO TABS: 325.0000 mg | ORAL_TABLET | Freq: Two times a day (BID) | ORAL | 5 refills | Status: DC

## 2017-07-02 ENCOUNTER — Encounter: Payer: Medicaid Other | Admitting: Obstetrics

## 2017-07-03 NOTE — L&D Delivery Note (Addendum)
Patient is a 27 y.o. now [id number] s/p NSVD at 2980w2d, who was admitted for SOL.  She progressed with augmentation with Pitocin to complete.  Cord clamping delayed by several minutes then clamped and cut by me with supervision.  Placenta intact and spontaneous, bleeding minimal.  1st degree labial laceration repaired by me, 2nd degree perineal and labial lacerations repaired by CNM without difficulty. She requests depo for birth control.  Delivery Note At 12:26 AM a viable female was delivered via Vaginal, Spontaneous (Presentation: OP) in usual fashion.  APGAR: 9, 9; weight pending.   Placenta status: intact.  Cord: 3 vessel without complications. Cord blood sent.  Anesthesia:  Epidural Episiotomy: None Lacerations: 2nd degree;Perineal;Labial Suture Repair: 3.0 monocryl Est. Blood Loss (mL):  300  Mom to postpartum.  Baby to Couplet care / Skin to Skin.  Ellwood DenseAlison Rumball, DO 08/25/17, 1:06 AM  Patient is a G3P1011 at 6180w2d who was admitted with SOL, uncomplicated prenatal course.  She progressed with augmentation via AROM/Pit.  I was gloved and present for delivery in its entirety.  Second stage of labor progressed, baby delivered after approx 8 ctx contractions.  Mild decels during second stage noted.  Complications: none  Lacerations: 2nd deg perineal, bilat labial  EBL: 300cc  Shemeca Lukasik, CNM 1:36 AM 08/25/2017  Please schedule this patient for Postpartum visit in: 4 weeks with the following provider: Any provider For C/S patients schedule nurse incision check in weeks 2 weeks: no Low risk pregnancy complicated by: none Delivery mode:  SVD Anticipated Birth Control:  Depo PP Procedures needed: none  Schedule Integrated BH visit: no

## 2017-07-16 ENCOUNTER — Encounter (HOSPITAL_COMMUNITY): Payer: Self-pay

## 2017-07-16 ENCOUNTER — Inpatient Hospital Stay (HOSPITAL_COMMUNITY)
Admission: AD | Admit: 2017-07-16 | Discharge: 2017-07-16 | Disposition: A | Discharge status: Home / Self Care | Payer: Medicaid Other | Source: Ambulatory Visit | Attending: Obstetrics and Gynecology | Admitting: Obstetrics and Gynecology

## 2017-07-16 DIAGNOSIS — Z3A32 32 weeks gestation of pregnancy: Secondary | ICD-10-CM | POA: Diagnosis not present

## 2017-07-16 DIAGNOSIS — R55 Syncope and collapse: Secondary | ICD-10-CM | POA: Diagnosis present

## 2017-07-16 DIAGNOSIS — D649 Anemia, unspecified: Secondary | ICD-10-CM | POA: Diagnosis not present

## 2017-07-16 DIAGNOSIS — O26893 Other specified pregnancy related conditions, third trimester: Secondary | ICD-10-CM | POA: Diagnosis not present

## 2017-07-16 DIAGNOSIS — O9989 Other specified diseases and conditions complicating pregnancy, childbirth and the puerperium: Secondary | ICD-10-CM | POA: Diagnosis not present

## 2017-07-16 DIAGNOSIS — Z87891 Personal history of nicotine dependence: Secondary | ICD-10-CM | POA: Insufficient documentation

## 2017-07-16 DIAGNOSIS — R0602 Shortness of breath: Secondary | ICD-10-CM | POA: Diagnosis not present

## 2017-07-16 DIAGNOSIS — O99013 Anemia complicating pregnancy, third trimester: Secondary | ICD-10-CM | POA: Diagnosis not present

## 2017-07-16 HISTORY — DX: Anemia, unspecified: D64.9

## 2017-07-16 LAB — URINALYSIS, ROUTINE W REFLEX MICROSCOPIC
Bilirubin Urine: NEGATIVE
GLUCOSE, UA: NEGATIVE mg/dL
Hgb urine dipstick: NEGATIVE
KETONES UR: NEGATIVE mg/dL
Nitrite: NEGATIVE
PH: 6 (ref 5.0–8.0)
Protein, ur: 30 mg/dL — AB
Specific Gravity, Urine: 1.024 (ref 1.005–1.030)

## 2017-07-16 LAB — CBC
HEMATOCRIT: 31 % — AB (ref 36.0–46.0)
HEMOGLOBIN: 10.3 g/dL — AB (ref 12.0–15.0)
MCH: 26.1 pg (ref 26.0–34.0)
MCHC: 33.2 g/dL (ref 30.0–36.0)
MCV: 78.5 fL (ref 78.0–100.0)
Platelets: 198 10*3/uL (ref 150–400)
RBC: 3.95 MIL/uL (ref 3.87–5.11)
RDW: 14.8 % (ref 11.5–15.5)
WBC: 7 10*3/uL (ref 4.0–10.5)

## 2017-07-16 MED ORDER — FERROUS SULFATE 220 (44 FE) MG/5ML PO ELIX: 220.0000 mg | ORAL_SOLUTION | Freq: Two times a day (BID) | ORAL | 0 refills | Status: AC

## 2017-07-16 NOTE — Discharge Instructions (Signed)

## 2017-07-16 NOTE — MAU Note (Signed)
Pt was feeling SOB yesterday and today. Went to work today and almost passed out. Said the room got black and she squatted down but didn't fall. She said she does have a hx of anemia.

## 2017-07-16 NOTE — MAU Provider Note (Signed)
History     CSN: [id number]  Arrival date and time: 07/16/17 0901   First Provider Initiated Contact with Patient 07/16/17 [phone]      Chief Complaint  Patient presents with  . Near Syncope   HPI Patty Atkins is a 27 y.o. G3P1011 at [redacted]w[redacted]d who presents for near syncope. Symptoms began yesterday. Reports at times she feels short of breath then the room starts to go black & she feels like she's going to pass out. Occurred once yesterday and once today. Both instances occurred while at work. Denies LOC, headache, chest pain, cough, abdominal pain, or vaginal bleeding. Positive fetal movement. Denies hx of asthma or cardiac issues.   OB History    Gravida Para Term Preterm AB Living   3 1 1   1 1    SAB TAB Ectopic Multiple Live Births     1     1      Past Medical History:  Diagnosis Date  . Anemia     Past Surgical History:  Procedure Laterality Date  . DILATION AND CURETTAGE OF UTERUS    . HERNIA REPAIR      Family History  Problem Relation Age of Onset  . Diabetes Maternal Grandmother     Social History   Tobacco Use  . Smoking status: Former Games developer  . Smokeless tobacco: Never Used  Substance Use Topics  . Alcohol use: No  . Drug use: No    Allergies: No Known Allergies  Medications Prior to Admission  Medication Sig Dispense Refill Last Dose  . acetaminophen (TYLENOL) 325 MG tablet Take 650 mg by mouth every 6 (six) hours as needed for mild pain or headache.   Taking  . calcium carbonate (TUMS - DOSED IN MG ELEMENTAL CALCIUM) 500 MG chewable tablet Chew 2 tablets by mouth 2 (two) times daily as needed for indigestion or heartburn.   Taking  . Cholecalciferol (VITAMIN D) 2000 units CAPS Take 1 capsule (2,000 Units total) by mouth daily before breakfast. (Patient not taking: Reported on 05/14/2017) 30 capsule 5 Not Taking  . ferrous sulfate 325 (65 FE) MG tablet Take 1 tablet (325 mg total) by mouth 2 (two) times daily at 8 am and 10 pm. 60 tablet 5   .  metoCLOPramide (REGLAN) 5 MG tablet Take 1 tablet (5 mg total) by mouth every 6 (six) hours as needed for nausea. (Patient not taking: Reported on 05/14/2017) 30 tablet 1 Not Taking  . Omega-3 Fatty Acids (FISH OIL) 1200 MG CAPS Take 1 capsule by mouth daily.   Not Taking  . omeprazole (PRILOSEC) 20 MG capsule Take 1 capsule (20 mg total) by mouth 2 (two) times daily before a meal. 60 capsule 5   . pantoprazole (PROTONIX) 20 MG tablet Take 1 tablet (20 mg total) by mouth daily. (Patient not taking: Reported on 05/14/2017) 30 tablet 1 Not Taking  . Prenatal Vit-Fe Fumarate-FA (PRENATAL MULTIVITAMIN) TABS tablet Take 1 tablet by mouth daily at 12 noon.   Taking  . promethazine (PHENERGAN) 25 MG tablet Take 25 mg by mouth every 6 (six) hours as needed for nausea or vomiting.   Not Taking    Review of Systems  Constitutional: Negative.   Respiratory: Positive for shortness of breath. Negative for cough, chest tightness and wheezing.   Cardiovascular: Negative.   Gastrointestinal: Negative.   Genitourinary: Negative.   Neurological: Positive for light-headedness. Negative for dizziness, syncope and headaches.   Physical Exam   Blood pressure 121/72, pulse  90, temperature 98 F (36.7 C), temperature source Oral, resp. rate 18, height 5\' 8"  (1.727 m), weight 234 lb (106.1 kg), last menstrual period 11/30/2016, SpO2 100 %. Pulse Rate:  [90] 90 (01/14 1136) Resp:  [18] 18 (01/14 1214) SpO2:  [100 %] 100 % (01/14 1146)  Physical Exam  Nursing note and vitals reviewed. Constitutional: She is oriented to person, place, and time. She appears well-developed and well-nourished. No distress.  HENT:  Head: Normocephalic and atraumatic.  Eyes: Conjunctivae are normal. Right eye exhibits no discharge. Left eye exhibits no discharge. No scleral icterus.  Neck: Normal range of motion.  Cardiovascular: Normal rate, regular rhythm and normal heart sounds.  No murmur heard. Respiratory: Effort normal and  breath sounds normal. No respiratory distress. She has no wheezes.  GI: Soft. There is no tenderness.  Neurological: She is alert and oriented to person, place, and time.  Skin: Skin is warm and dry. She is not diaphoretic.  Psychiatric: She has a normal mood and affect. Her behavior is normal. Judgment and thought content normal.    MAU Course  Procedures Results for orders placed or performed during the hospital encounter of 07/16/17 (from the past 48 hour(s))  Urinalysis, Routine w reflex microscopic     Status: Abnormal   Collection Time: 07/16/17  9:26 AM  Result Value Ref Range   Color, Urine YELLOW YELLOW   APPearance HAZY (A) CLEAR   Specific Gravity, Urine 1.024 1.005 - 1.030   pH 6.0 5.0 - 8.0   Glucose, UA NEGATIVE NEGATIVE mg/dL   Hgb urine dipstick NEGATIVE NEGATIVE   Bilirubin Urine NEGATIVE NEGATIVE   Ketones, ur NEGATIVE NEGATIVE mg/dL   Protein, ur 30 (A) NEGATIVE mg/dL   Nitrite NEGATIVE NEGATIVE   Leukocytes, UA TRACE (A) NEGATIVE   RBC / HPF 0-5 0 - 5 RBC/hpf   WBC, UA 6-30 0 - 5 WBC/hpf   Bacteria, UA RARE (A) NONE SEEN   Squamous Epithelial / LPF 0-5 (A) NONE SEEN   Mucus PRESENT    Hyaline Casts, UA PRESENT    Ca Oxalate Crys, UA PRESENT   CBC     Status: Abnormal   Collection Time: 07/16/17 10:12 AM  Result Value Ref Range   WBC 7.0 4.0 - 10.5 K/uL   RBC 3.95 3.87 - 5.11 MIL/uL   Hemoglobin 10.3 (L) 12.0 - 15.0 g/dL   HCT 16.131.0 (L) 09.636.0 - 04.546.0 %   MCV 78.5 78.0 - 100.0 fL   MCH 26.1 26.0 - 34.0 pg   MCHC 33.2 30.0 - 36.0 g/dL   RDW 40.914.8 81.111.5 - 91.415.5 %   Platelets 198 150 - 400 K/uL    MDM NST:  Baseline: 125 bpm, Variability: Good {> 6 bpm), Accelerations: Reactive and Decelerations: Absent VSS, NAD. Sp02 remains 100% in MAU CBC & EKG Pt states she's anemic & she hasn't been taking her iron supplement d/t intolerance, requesting rx for liquid form  Assessment and Plan  A:  1. Shortness of breath due to pregnancy in third trimester   2. [redacted]  weeks gestation of pregnancy   3. Anemia affecting pregnancy in third trimester    P: Discharge home Rx ferosul Increase water intake, eat every 2-3 hours, slow position changes Discussed reasons to return to MAU Reschedule next ob appt --- stressed importance of keeping ob appts.   Judeth Hornrin Edwin Cherian 07/16/2017, 9:42 AM

## 2017-07-23 ENCOUNTER — Other Ambulatory Visit: Payer: Self-pay | Admitting: Obstetrics

## 2017-07-23 ENCOUNTER — Encounter: Payer: Self-pay | Admitting: Obstetrics

## 2017-07-23 ENCOUNTER — Ambulatory Visit (INDEPENDENT_AMBULATORY_CARE_PROVIDER_SITE_OTHER): Payer: Medicaid Other | Admitting: Obstetrics

## 2017-07-23 VITALS — BP 114/78 | HR 118 | Wt 236.0 lb

## 2017-07-23 DIAGNOSIS — R0789 Other chest pain: Secondary | ICD-10-CM

## 2017-07-23 DIAGNOSIS — O219 Vomiting of pregnancy, unspecified: Secondary | ICD-10-CM

## 2017-07-23 DIAGNOSIS — R0602 Shortness of breath: Secondary | ICD-10-CM

## 2017-07-23 DIAGNOSIS — IMO0002 Reserved for concepts with insufficient information to code with codable children: Secondary | ICD-10-CM

## 2017-07-23 DIAGNOSIS — Z3483 Encounter for supervision of other normal pregnancy, third trimester: Secondary | ICD-10-CM

## 2017-07-23 DIAGNOSIS — Z3689 Encounter for other specified antenatal screening: Secondary | ICD-10-CM

## 2017-07-23 DIAGNOSIS — Z0489 Encounter for examination and observation for other specified reasons: Secondary | ICD-10-CM

## 2017-07-23 DIAGNOSIS — R42 Dizziness and giddiness: Secondary | ICD-10-CM

## 2017-07-23 DIAGNOSIS — Z3480 Encounter for supervision of other normal pregnancy, unspecified trimester: Secondary | ICD-10-CM

## 2017-07-23 NOTE — Progress Notes (Addendum)
Subjective:  Patty BihariCurnisha Atkins is a 27 y.o. G3P1011 at 5751w4d being seen today for ongoing prenatal care.  She is currently monitored for the following issues for this low-risk pregnancy and has Supervision of other normal pregnancy, antepartum on their problem list.  Patient reports nausea and vomiting and chest pressure with SOB.  Also has dizzy spells, and fell out at work ~ a week ago and [facility].  She says nothing was found to be abnormal.  Contractions: Not present. Vag. Bleeding: None.  Movement: Present. Denies leaking of fluid.   The following portions of the patient's history were reviewed and updated as appropriate: allergies, current medications, past family history, past medical history, past social history, past surgical history and problem list. Problem list updated.  Objective:   Vitals:   07/23/17 1058  BP: 114/78  Pulse: (!) 118  Weight: 236 lb (107 kg)    Fetal Status: Fetal Heart Rate (bpm): 150   Movement: Present     General:  Alert, oriented and cooperative. Patient is in no acute distress.  Skin: Skin is warm and dry. No rash noted.   Cardiovascular: Normal heart rate noted  Respiratory: Normal respiratory effort, no problems with respiration noted  Abdomen: Soft, gravid, appropriate for gestational age. Pain/Pressure: Absent     Pelvic:  Cervical exam deferred        Extremities: Normal range of motion.  Edema: None  Mental Status: Normal mood and affect. Normal behavior. Normal judgment and thought content.   Urinalysis:      Assessment and Plan:  Pregnancy: G3P1011 at 6751w4d  1. Supervision of other normal pregnancy, antepartum   2. Nausea and vomiting in pregnancy - [facility] of the sudden onset of N/V, dizziness, chest pressure and SOB  3. Dizziness of unknown cause - BP stable  4. Sensation of chest pressure - denies chest pain  5. SOB (shortness of breath)  Preterm labor symptoms and general  obstetric precautions including but not limited to vaginal bleeding, contractions, leaking of fluid and fetal movement were reviewed in detail with the patient. Please refer to After Visit Summary for other counseling recommendations.  Return in about 2 weeks (around 08/06/2017) for ROB.   Brock BadHarper, Jamese Trauger A, MD

## 2017-07-23 NOTE — Progress Notes (Signed)
Pt had Anatomy U/S 04/12/17 that needed additional imaging  4 weeks after  F/U was not scheduled per pt. Pt is concerned.

## 2017-07-24 ENCOUNTER — Telehealth: Payer: Self-pay | Admitting: Obstetrics

## 2017-07-24 NOTE — Telephone Encounter (Signed)
Telephone call to patient to follow up.  Patient was sent by EMS yesterday from the office to be evaluated.  I noticed there was not MAU or ED note.  Patient stated that since she felt better and her BP was normal, she after discussing with EMS declined transport.  She felt she "got over heated from eating a large breakfast and improved after she threw up".  Patient stated EMS did not feel she needed to go to the hospital based on her symptoms.  Patient declines any symptoms today.  She was instructed to call us should she have any issues.  Follow up ROB scheduled.

## 2017-08-01 ENCOUNTER — Ambulatory Visit (HOSPITAL_COMMUNITY)
Admission: RE | Admit: 2017-08-01 | Discharge: 2017-08-01 | Disposition: A | Discharge status: Home / Self Care | Payer: Medicaid Other | Source: Ambulatory Visit | Attending: Obstetrics | Admitting: Obstetrics

## 2017-08-01 DIAGNOSIS — Z3689 Encounter for other specified antenatal screening: Secondary | ICD-10-CM | POA: Insufficient documentation

## 2017-08-01 DIAGNOSIS — Z362 Encounter for other antenatal screening follow-up: Secondary | ICD-10-CM | POA: Insufficient documentation

## 2017-08-01 DIAGNOSIS — Z0489 Encounter for examination and observation for other specified reasons: Secondary | ICD-10-CM

## 2017-08-01 DIAGNOSIS — IMO0002 Reserved for concepts with insufficient information to code with codable children: Secondary | ICD-10-CM

## 2017-08-06 ENCOUNTER — Encounter: Payer: Medicaid Other | Admitting: Obstetrics

## 2017-08-13 ENCOUNTER — Inpatient Hospital Stay (HOSPITAL_COMMUNITY)
Admission: AD | Admit: 2017-08-13 | Discharge: 2017-08-13 | Disposition: A | Discharge status: Home / Self Care | Payer: Medicaid Other | Source: Ambulatory Visit | Attending: Obstetrics and Gynecology | Admitting: Obstetrics and Gynecology

## 2017-08-13 ENCOUNTER — Encounter (HOSPITAL_COMMUNITY): Payer: Self-pay | Admitting: *Deleted

## 2017-08-13 DIAGNOSIS — Z3A36 36 weeks gestation of pregnancy: Secondary | ICD-10-CM | POA: Diagnosis not present

## 2017-08-13 DIAGNOSIS — O26893 Other specified pregnancy related conditions, third trimester: Secondary | ICD-10-CM | POA: Insufficient documentation

## 2017-08-13 DIAGNOSIS — Z0371 Encounter for suspected problem with amniotic cavity and membrane ruled out: Secondary | ICD-10-CM

## 2017-08-13 DIAGNOSIS — N898 Other specified noninflammatory disorders of vagina: Secondary | ICD-10-CM | POA: Diagnosis present

## 2017-08-13 LAB — URINALYSIS, ROUTINE W REFLEX MICROSCOPIC
Bilirubin Urine: NEGATIVE
GLUCOSE, UA: 50 mg/dL — AB
Hgb urine dipstick: NEGATIVE
Ketones, ur: NEGATIVE mg/dL
Nitrite: NEGATIVE
PH: 6 (ref 5.0–8.0)
Protein, ur: 30 mg/dL — AB
Specific Gravity, Urine: 1.02 (ref 1.005–1.030)

## 2017-08-13 LAB — POCT FERN TEST: POCT FERN TEST: NEGATIVE

## 2017-08-13 LAB — AMNISURE RUPTURE OF MEMBRANE (ROM) NOT AT ARMC: Amnisure ROM: NEGATIVE

## 2017-08-13 NOTE — MAU Provider Note (Signed)
S: Patty Atkins is a 27 y.o. G3P1011 at 1340w4d  who presents to MAU today complaining of leaking of fluid since 10 am this morning. She denies vaginal bleeding. She denies contractions. She reports normal fetal movement.  Says she had one gush of fluid and no other leaking through out the day.   O: BP (!) 102/56   Pulse (!) 117   Temp 98.2 F (36.8 C)   Resp 18   Ht (P) 5\' 8"  (1.727 m)   Wt (P) 239 lb (108.4 kg)   LMP 11/30/2016 (Exact Date)   BMI (P) 36.34 kg/m  GENERAL: Well-developed, well-nourished female in no acute distress.  HEAD: Normocephalic, atraumatic.  CHEST: Normal effort of breathing, regular heart rate ABDOMEN: Soft, nontender, gravid   Cervical exam:  Deferred   Fetal Monitoring: Baseline: 130 bpm Variability: moderate Accelerations: 15x15 Decelerations: none Contractions: quiet.   Results for orders placed or performed during the hospital encounter of 08/13/17 (from the past 24 hour(s))  Urinalysis, Routine w reflex microscopic     Status: Abnormal   Collection Time: 08/13/17 12:27 PM  Result Value Ref Range   Color, Urine YELLOW YELLOW   APPearance HAZY (A) CLEAR   Specific Gravity, Urine 1.020 1.005 - 1.030   pH 6.0 5.0 - 8.0   Glucose, UA 50 (A) NEGATIVE mg/dL   Hgb urine dipstick NEGATIVE NEGATIVE   Bilirubin Urine NEGATIVE NEGATIVE   Ketones, ur NEGATIVE NEGATIVE mg/dL   Protein, ur 30 (A) NEGATIVE mg/dL   Nitrite NEGATIVE NEGATIVE   Leukocytes, UA TRACE (A) NEGATIVE   RBC / HPF 0-5 0 - 5 RBC/hpf   WBC, UA 6-30 0 - 5 WBC/hpf   Bacteria, UA RARE (A) NONE SEEN   Squamous Epithelial / LPF 6-30 (A) NONE SEEN   Mucus PRESENT   Am[facility]     Status: None   Collection Time: 08/13/17  1:04 PM  Result Value Ref Range   Amnisure ROM NEGATIVE   Fern Test     Status: None   Collection Time: 08/13/17  1:32 PM  Result Value Ref Range   POCT Fern Test Negative = intact amniotic membranes     A: SIUP at 7740w4d   Membranes intact  P: Amnisure negative today Follow up with OB as scheduled Return to MAU if symptoms worsen   Venia Carbonasch, Zarya Lasseigne I, NP 08/13/2017 1:48 PM

## 2017-08-13 NOTE — Discharge Instructions (Signed)

## 2017-08-13 NOTE — MAU Note (Signed)
Pt reports she woke up in a pool af fluid this morning. No leaking since. Reports she had some cramping last night but none today. Good fetal movement felt

## 2017-08-15 ENCOUNTER — Ambulatory Visit (INDEPENDENT_AMBULATORY_CARE_PROVIDER_SITE_OTHER): Payer: Medicaid Other | Admitting: Obstetrics

## 2017-08-15 ENCOUNTER — Encounter: Payer: Self-pay | Admitting: Obstetrics

## 2017-08-15 ENCOUNTER — Other Ambulatory Visit (HOSPITAL_COMMUNITY)
Admission: RE | Admit: 2017-08-15 | Discharge: 2017-08-15 | Disposition: A | Discharge status: Home / Self Care | Payer: Medicaid Other | Source: Ambulatory Visit | Attending: Obstetrics | Admitting: Obstetrics

## 2017-08-15 VITALS — BP 128/76 | HR 84 | Wt 240.4 lb

## 2017-08-15 DIAGNOSIS — Z3480 Encounter for supervision of other normal pregnancy, unspecified trimester: Secondary | ICD-10-CM

## 2017-08-15 DIAGNOSIS — Z3483 Encounter for supervision of other normal pregnancy, third trimester: Secondary | ICD-10-CM

## 2017-08-15 DIAGNOSIS — N898 Other specified noninflammatory disorders of vagina: Secondary | ICD-10-CM

## 2017-08-15 DIAGNOSIS — O2243 Hemorrhoids in pregnancy, third trimester: Secondary | ICD-10-CM

## 2017-08-15 DIAGNOSIS — O2240 Hemorrhoids in pregnancy, unspecified trimester: Secondary | ICD-10-CM

## 2017-08-15 MED ORDER — HYDROCORTISONE ACETATE 25 MG RE SUPP: 25.0000 mg | Freq: Two times a day (BID) | RECTAL | 11 refills | Status: DC

## 2017-08-15 NOTE — Addendum Note (Signed)
Addended by: Coral CeoHARPER, Lekeya Rollings A on: 08/15/2017 10:58 AM   Modules accepted: Orders

## 2017-08-15 NOTE — Progress Notes (Addendum)
Subjective:  Patty BihariCurnisha Atkins is a 27 y.o. G3P1011 at 6067w6d being seen today for ongoing prenatal care.  She is currently monitored for the following issues for this low-risk pregnancy and has Supervision of other normal pregnancy, antepartum on their problem list.  Patient reports irritating hemorrhoids.  Contractions: Irritability. Vag. Bleeding: None.  Movement: Present. Denies leaking of fluid.   The following portions of the patient's history were reviewed and updated as appropriate: allergies, current medications, past family history, past medical history, past social history, past surgical history and problem list. Problem list updated.  Objective:   Vitals:   08/15/17 1011  BP: 128/76  Pulse: 84  Weight: 240 lb 6.4 oz (109 kg)    Fetal Status: Fetal Heart Rate (bpm): 150   Movement: Present     General:  Alert, oriented and cooperative. Patient is in no acute distress.  Skin: Skin is warm and dry. No rash noted.   Cardiovascular: Normal heart rate noted  Respiratory: Normal respiratory effort, no problems with respiration noted  Abdomen: Soft, gravid, appropriate for gestational age. Pain/Pressure: Present     Pelvic:  Cervical exam deferred        Extremities: Normal range of motion.  Edema: Trace  Mental Status: Normal mood and affect. Normal behavior. Normal judgment and thought content.   Urinalysis:      Assessment and Plan:  Pregnancy: G3P1011 at 2267w6d  1. Supervision of other normal pregnancy, antepartum Rx: - Strep Gp B NAA  2. Vaginal discharge Rx: - Cervicovaginal ancillary only  3. Hemorrhoids during pregnancy, antepartum Rx: - hydrocortisone (ANUSOL-HC) 25 MG suppository; Place 1 suppository (25 mg total) rectally 2 (two) times daily.  Dispense: 12 suppository; Refill: 11  Term labor symptoms and general obstetric precautions including but not limited to vaginal bleeding, contractions, leaking of fluid and fetal movement were reviewed in detail with the  patient. Please refer to After Visit Summary for other counseling recommendations.  Return in about 2 weeks (around 08/29/2017) for ROB.   Brock BadHarper, Merryn Thaker A, MD

## 2017-08-16 LAB — CERVICOVAGINAL ANCILLARY ONLY
BACTERIAL VAGINITIS: NEGATIVE
CANDIDA VAGINITIS: NEGATIVE
Chlamydia: NEGATIVE
NEISSERIA GONORRHEA: NEGATIVE
TRICH (WINDOWPATH): NEGATIVE

## 2017-08-17 LAB — STREP GP B NAA: STREP GROUP B AG: POSITIVE — AB

## 2017-08-21 ENCOUNTER — Encounter: Payer: Medicaid Other | Admitting: Obstetrics

## 2017-08-22 ENCOUNTER — Other Ambulatory Visit: Payer: Self-pay

## 2017-08-22 ENCOUNTER — Encounter: Payer: Self-pay | Admitting: Obstetrics

## 2017-08-22 ENCOUNTER — Ambulatory Visit (INDEPENDENT_AMBULATORY_CARE_PROVIDER_SITE_OTHER): Payer: Medicaid Other | Admitting: Obstetrics

## 2017-08-22 DIAGNOSIS — Z3483 Encounter for supervision of other normal pregnancy, third trimester: Secondary | ICD-10-CM | POA: Diagnosis not present

## 2017-08-22 DIAGNOSIS — Z3480 Encounter for supervision of other normal pregnancy, unspecified trimester: Secondary | ICD-10-CM

## 2017-08-22 NOTE — Progress Notes (Signed)
Complains of breathing problems. She wants to be checked.

## 2017-08-22 NOTE — Progress Notes (Signed)
Subjective:  Patty Atkins is a 27 y.o. G3P1011 at 2680w6d being seen today for ongoing prenatal care.  She is currently monitored for the following issues for this low-risk pregnancy and has Supervision of other normal pregnancy, antepartum on their problem list.  Patient reports having problems breathing.  Contractions: Irritability. Vag. Bleeding: None.  Movement: Present. Denies leaking of fluid.   The following portions of the patient's history were reviewed and updated as appropriate: allergies, current medications, past family history, past medical history, past social history, past surgical history and problem list. Problem list updated.  Objective:   Vitals:   08/22/17 1551  BP: 117/68  Pulse: 93  Weight: 247 lb 12.8 oz (112.4 kg)    Fetal Status:     Movement: Present     General:  Alert, oriented and cooperative. Patient is in no acute distress.  Skin: Skin is warm and dry. No rash noted.   Cardiovascular: Normal heart rate noted  Respiratory: Normal respiratory effort, no problems with respiration noted  Abdomen: Soft, gravid, appropriate for gestational age. Pain/Pressure: Present     Pelvic:  Cervical exam performed      Cvx:  1-2 cm / 50% / soft / -3 / Vtx  Extremities: Normal range of motion.  Edema: Trace  Mental Status: Normal mood and affect. Normal behavior. Normal judgment and thought content.   Urinalysis:      Assessment and Plan:  Pregnancy: G3P1011 at 6980w6d  1. Supervision of other normal pregnancy, antepartum   Term labor symptoms and general obstetric precautions including but not limited to vaginal bleeding, contractions, leaking of fluid and fetal movement were reviewed in detail with the patient. Please refer to After Visit Summary for other counseling recommendations.  Return in about 1 week (around 08/29/2017) for ROB.   Brock BadHarper, Maura Braaten A, MD

## 2017-08-23 ENCOUNTER — Inpatient Hospital Stay (HOSPITAL_COMMUNITY)
Admission: AD | Admit: 2017-08-23 | Discharge: 2017-08-24 | Disposition: A | Discharge status: Home / Self Care | Payer: Medicaid Other | Source: Ambulatory Visit | Attending: Obstetrics & Gynecology | Admitting: Obstetrics & Gynecology

## 2017-08-23 DIAGNOSIS — O479 False labor, unspecified: Secondary | ICD-10-CM

## 2017-08-24 ENCOUNTER — Inpatient Hospital Stay (HOSPITAL_COMMUNITY): Payer: Medicaid Other | Admitting: Anesthesiology

## 2017-08-24 ENCOUNTER — Encounter (HOSPITAL_COMMUNITY): Payer: Self-pay | Admitting: *Deleted

## 2017-08-24 ENCOUNTER — Inpatient Hospital Stay (HOSPITAL_COMMUNITY)
Admission: EM | Admit: 2017-08-24 | Discharge: 2017-08-27 | DRG: 807 | Disposition: A | Discharge status: Home / Self Care | Payer: Medicaid Other | Attending: Obstetrics and Gynecology | Admitting: Obstetrics and Gynecology

## 2017-08-24 ENCOUNTER — Encounter (HOSPITAL_COMMUNITY): Payer: Self-pay

## 2017-08-24 DIAGNOSIS — Z87891 Personal history of nicotine dependence: Secondary | ICD-10-CM | POA: Diagnosis not present

## 2017-08-24 DIAGNOSIS — Z3A38 38 weeks gestation of pregnancy: Secondary | ICD-10-CM

## 2017-08-24 DIAGNOSIS — Z3480 Encounter for supervision of other normal pregnancy, unspecified trimester: Secondary | ICD-10-CM

## 2017-08-24 DIAGNOSIS — O99824 Streptococcus B carrier state complicating childbirth: Principal | ICD-10-CM | POA: Diagnosis present

## 2017-08-24 DIAGNOSIS — O9902 Anemia complicating childbirth: Secondary | ICD-10-CM | POA: Diagnosis not present

## 2017-08-24 DIAGNOSIS — Z3490 Encounter for supervision of normal pregnancy, unspecified, unspecified trimester: Secondary | ICD-10-CM

## 2017-08-24 DIAGNOSIS — D649 Anemia, unspecified: Secondary | ICD-10-CM | POA: Diagnosis not present

## 2017-08-24 DIAGNOSIS — Z3483 Encounter for supervision of other normal pregnancy, third trimester: Secondary | ICD-10-CM | POA: Diagnosis present

## 2017-08-24 LAB — CBC
HEMATOCRIT: 30 % — AB (ref 36.0–46.0)
HEMOGLOBIN: 10 g/dL — AB (ref 12.0–15.0)
MCH: 24.9 pg — ABNORMAL LOW (ref 26.0–34.0)
MCHC: 33.3 g/dL (ref 30.0–36.0)
MCV: 74.6 fL — AB (ref 78.0–100.0)
Platelets: 198 10*3/uL (ref 150–400)
RBC: 4.02 MIL/uL (ref 3.87–5.11)
RDW: 16 % — AB (ref 11.5–15.5)
WBC: 6.3 10*3/uL (ref 4.0–10.5)

## 2017-08-24 LAB — POCT FERN TEST: POCT Fern Test: NEGATIVE

## 2017-08-24 LAB — TYPE AND SCREEN
ABO/RH(D): A POS
ANTIBODY SCREEN: NEGATIVE

## 2017-08-24 MED ORDER — PHENYLEPHRINE 40 MCG/ML (10ML) SYRINGE FOR IV PUSH (FOR BLOOD PRESSURE SUPPORT)
80.0000 ug | PREFILLED_SYRINGE | INTRAVENOUS | Status: DC | PRN
Start: 2017-08-24 — End: 2017-08-25
  Filled 2017-08-24: qty 5
  Filled 2017-08-24: qty 10

## 2017-08-24 MED ORDER — ONDANSETRON HCL 4 MG/2ML IJ SOLN
4.0000 mg | Freq: Four times a day (QID) | INTRAMUSCULAR | Status: DC | PRN
  Administered 2017-08-24: 4 mg via INTRAVENOUS
  Filled 2017-08-24: qty 2

## 2017-08-24 MED ORDER — DIPHENHYDRAMINE HCL 50 MG/ML IJ SOLN: 12.5000 mg | INTRAMUSCULAR | Status: DC | PRN

## 2017-08-24 MED ORDER — LACTATED RINGERS IV SOLN: 500.0000 mL | Freq: Once | INTRAVENOUS | Status: DC

## 2017-08-24 MED ORDER — OXYTOCIN 40 UNITS IN LACTATED RINGERS INFUSION - SIMPLE MED
1.0000 m[IU]/min | INTRAVENOUS | Status: DC
  Administered 2017-08-24: 2 m[IU]/min via INTRAVENOUS
  Filled 2017-08-24: qty 1000

## 2017-08-24 MED ORDER — SODIUM CHLORIDE 0.9 % IV SOLN
5.0000 10*6.[IU] | Freq: Once | INTRAVENOUS | Status: AC
  Administered 2017-08-24: 5 10*6.[IU] via INTRAVENOUS
  Filled 2017-08-24: qty 5

## 2017-08-24 MED ORDER — EPHEDRINE 5 MG/ML INJ
10.0000 mg | INTRAVENOUS | Status: DC | PRN
  Filled 2017-08-24: qty 2

## 2017-08-24 MED ORDER — LIDOCAINE HCL (PF) 1 % IJ SOLN
30.0000 mL | INTRAMUSCULAR | Status: DC | PRN
  Filled 2017-08-24: qty 30

## 2017-08-24 MED ORDER — LACTATED RINGERS IV SOLN
500.0000 mL | Freq: Once | INTRAVENOUS | Status: AC
  Administered 2017-08-24: 500 mL via INTRAVENOUS

## 2017-08-24 MED ORDER — SOD CITRATE-CITRIC ACID 500-334 MG/5ML PO SOLN: 30.0000 mL | ORAL | Status: DC | PRN

## 2017-08-24 MED ORDER — TERBUTALINE SULFATE 1 MG/ML IJ SOLN
0.2500 mg | Freq: Once | INTRAMUSCULAR | Status: DC | PRN
  Filled 2017-08-24: qty 1

## 2017-08-24 MED ORDER — ACETAMINOPHEN 325 MG PO TABS: 650.0000 mg | ORAL_TABLET | ORAL | Status: DC | PRN

## 2017-08-24 MED ORDER — SODIUM CHLORIDE 0.9 % IV BOLUS (SEPSIS): 1000.0000 mL | Freq: Once | INTRAVENOUS | Status: DC

## 2017-08-24 MED ORDER — FENTANYL 2.5 MCG/ML BUPIVACAINE 1/10 % EPIDURAL INFUSION (WH - ANES)
14.0000 mL/h | INTRAMUSCULAR | Status: DC | PRN
  Administered 2017-08-24: 14 mL/h via EPIDURAL
  Filled 2017-08-24: qty 100

## 2017-08-24 MED ORDER — PHENYLEPHRINE 40 MCG/ML (10ML) SYRINGE FOR IV PUSH (FOR BLOOD PRESSURE SUPPORT)
80.0000 ug | PREFILLED_SYRINGE | INTRAVENOUS | Status: DC | PRN
  Filled 2017-08-24: qty 5

## 2017-08-24 MED ORDER — OXYTOCIN BOLUS FROM INFUSION
500.0000 mL | Freq: Once | INTRAVENOUS | Status: AC
  Administered 2017-08-25: 500 mL via INTRAVENOUS

## 2017-08-24 MED ORDER — OXYTOCIN 40 UNITS IN LACTATED RINGERS INFUSION - SIMPLE MED
2.5000 [IU]/h | INTRAVENOUS | Status: DC
  Administered 2017-08-25: 2.5 [IU]/h via INTRAVENOUS

## 2017-08-24 MED ORDER — FLEET ENEMA 7-19 GM/118ML RE ENEM: 1.0000 | ENEMA | RECTAL | Status: DC | PRN

## 2017-08-24 MED ORDER — LIDOCAINE HCL (PF) 1 % IJ SOLN
INTRAMUSCULAR | Status: DC | PRN
  Administered 2017-08-24 (×2): 4 mL via EPIDURAL

## 2017-08-24 MED ORDER — PENICILLIN G POT IN DEXTROSE 60000 UNIT/ML IV SOLN
3.0000 10*6.[IU] | INTRAVENOUS | Status: DC
  Administered 2017-08-24 (×2): 3 10*6.[IU] via INTRAVENOUS
  Filled 2017-08-24 (×9): qty 50

## 2017-08-24 MED ORDER — OXYCODONE-ACETAMINOPHEN 5-325 MG PO TABS: 1.0000 | ORAL_TABLET | ORAL | Status: DC | PRN

## 2017-08-24 MED ORDER — OXYCODONE-ACETAMINOPHEN 5-325 MG PO TABS: 2.0000 | ORAL_TABLET | ORAL | Status: DC | PRN

## 2017-08-24 MED ORDER — LACTATED RINGERS IV SOLN: 500.0000 mL | INTRAVENOUS | Status: DC | PRN

## 2017-08-24 MED ORDER — LACTATED RINGERS IV SOLN
INTRAVENOUS | Status: DC
  Administered 2017-08-24 (×2): via INTRAVENOUS

## 2017-08-24 NOTE — MAU Note (Signed)
Pt presents with contractions that started at 10pm that are every 5-7 mins. Pt denies LOF or vaginal bleeding. Reports good fetal movement. States cervix was 2cm 2 days ago.

## 2017-08-24 NOTE — ED Provider Notes (Addendum)
[facility]-EMERGENCY DEPT Provider Note   CSN: [id number] Arrival date & time: 08/24/17  4098     History   Chief Complaint Chief Complaint  Patient presents with  . Contractions    HPI Patty Atkins is a 27 y.o. female.  HPI  27 year old G56P1011 female at [redacted]w[redacted]d presents with contractions.  Started last night around 10 PM.  Went to [facility] and was released this morning and was diagnosed with false labor.  However since then she her contractions have returned and feel more like real labor to her.  She states that they were 5 minutes apart until arriving here and now are about 3 minutes apart.  They are lasting about a minute.  She has not had any vaginal bleeding, any leakage of fluid or rupture, and is still feeling baby move adequately.  She was going to drive to [facility] but Patty Atkins was closer to her so she came here instead.  Past Medical History:  Diagnosis Date  . Anemia     Patient Active Problem List   Diagnosis Date Noted  . Supervision of other normal pregnancy, antepartum 03/06/2017    Past Surgical History:  Procedure Laterality Date  . DILATION AND CURETTAGE OF UTERUS    . HERNIA REPAIR      OB History    Gravida Para Term Preterm AB Living   3 1 1   1 1    SAB TAB Ectopic Multiple Live Births     1     1       Home Medications    Prior to Admission medications   Medication Sig Start Date End Date Taking? Authorizing Provider  ferrous sulfate (FEROSUL) 220 (44 Fe) MG/5ML solution Take 5 mLs (220 mg total) by mouth 2 (two) times daily with a meal. 07/16/17   Judeth Horn, NP  hydrocortisone (ANUSOL-HC) 25 MG suppository Place 1 suppository (25 mg total) rectally 2 (two) times daily. 08/15/17   Brock Bad, MD  Prenatal Vit-Fe Fumarate-FA (PRENATAL MULTIVITAMIN) TABS tablet Take 1 tablet by mouth daily at 12 noon.    [provider]  promethazine (PHENERGAN) 25 MG tablet Take 25 mg by mouth  every 6 (six) hours as needed for nausea or vomiting.    [provider]    Family History Family History  Problem Relation Age of Onset  . Diabetes Maternal Grandmother     Social History Social History   Tobacco Use  . Smoking status: Former Games developer  . Smokeless tobacco: Never Used  Substance Use Topics  . Alcohol use: No  . Drug use: No     Allergies   Patient has no known allergies.   Review of Systems Review of Systems  Constitutional: Negative for fever.  Gastrointestinal: Positive for abdominal pain.  Genitourinary: Negative for vaginal bleeding and vaginal discharge.  All other systems reviewed and are negative.    Physical Exam Updated Vital Signs BP 136/85 (BP Location: Right Arm)   Pulse 100   Temp (!) 97.5 F (36.4 C) (Oral)   Resp 20   LMP 11/30/2016 (Exact Date)   SpO2 100%   Physical Exam  Constitutional: She is oriented to person, place, and time. She appears well-developed and well-nourished. No distress.  HENT:  Head: Normocephalic and atraumatic.  Right Ear: External ear normal.  Left Ear: External ear normal.  Nose: Nose normal.  Eyes: Right eye exhibits no discharge. Left eye exhibits no discharge.  Cardiovascular:  Normal rate, regular rhythm and normal heart sounds.  Pulmonary/Chest: Effort normal and breath sounds normal.  Abdominal: Soft. There is no tenderness.  gravid  Neurological: She is alert and oriented to person, place, and time.  Skin: Skin is warm and dry. She is not diaphoretic.  Nursing note and vitals reviewed.    ED Treatments / Results  Labs (all labs ordered are listed, but only abnormal results are displayed) Labs Reviewed - No data to display  EKG  EKG Interpretation None       Radiology No results found.  Procedures Procedures (including critical care time)  Medications Ordered in ED Medications  sodium chloride 0.9 % bolus 1,000 mL (not administered)     Initial Impression /  Assessment and Plan / ED Course  I have reviewed the triage vital signs and the nursing notes.  Pertinent labs & imaging results that were available during my care of the patient were reviewed by me and considered in my medical decision making (see chart for details).     Patient presents in active labor with increasing contractions.  She is currently stable and her contractions are about 3 minutes apart.  Fetal heart rate is stable.  Rapid response nurse at bedside and has checked patient and she is a proximally 4 cm dilated.  [facility], Dr. Graylin ShiverAnyanw is the accepting physician.  She currently appears stable for transport.  Final Clinical Impressions(s) / ED Diagnoses   Final diagnoses:  Active preterm labor, single or unspecified fetus    ED Discharge Orders    None       [name], Lionell Matuszak, MD 08/24/17 1041    [name], [name], MD 08/24/17 [phone]

## 2017-08-24 NOTE — ED Triage Notes (Signed)
Pt is [redacted] weeks pregnant, due March 7; OB is Dr. Clearance CootsHarper. Pt states contractions are 2-5 minutes apart lasting 1 minute each time.

## 2017-08-24 NOTE — MAU Note (Signed)
I have communicated with Dr. Linwood Dibblesumball and reviewed vital signs:  Vitals:   08/24/17 0033 08/24/17 0153  BP: 139/90 116/65  Pulse: 79 88  Resp: 18 20  Temp: 98.4 F (36.9 C)   SpO2: 99%     Vaginal exam:  Dilation: 2 Effacement (%): 60, 70 Cervical Position: Posterior Station: -3 Presentation: Vertex Exam by:: Latricia HeftAnna Allysha Tryon, RN,   Also reviewed contraction pattern and that non-stress test is reactive.  It has been documented that patient is contracting every 5-7 minutes with mininmal cervical change over 1 hour not indicating active labor.  Patient denies any other complaints.  Based on this report provider has given order for discharge.  A discharge order and diagnosis entered by a provider.   Labor discharge instructions reviewed with patient.

## 2017-08-24 NOTE — Anesthesia Preprocedure Evaluation (Signed)
Anesthesia Evaluation  Patient identified by MRN, date of birth, ID band Patient awake    Reviewed: Allergy & Precautions, Patient's Chart, lab work & pertinent test results  Airway Mallampati: II  TM Distance: >3 FB Neck ROM: Full    Dental no notable dental hx. (+) Teeth Intact   Pulmonary former smoker,    Pulmonary exam normal breath sounds clear to auscultation       Cardiovascular negative cardio ROS Normal cardiovascular exam Rhythm:Regular Rate:Normal     Neuro/Psych negative neurological ROS  negative psych ROS   GI/Hepatic negative GI ROS, Neg liver ROS,   Endo/Other  Obesity  Renal/GU negative Renal ROS  negative genitourinary   Musculoskeletal negative musculoskeletal ROS (+)   Abdominal (+) + obese,   Peds  Hematology  (+) anemia ,   Anesthesia Other Findings   Reproductive/Obstetrics (+) Pregnancy                             Anesthesia Physical Anesthesia Plan  ASA: II  Anesthesia Plan: Epidural   Post-op Pain Management:    Induction:   PONV Risk Score and Plan:   Airway Management Planned: Natural Airway  Additional Equipment:   Intra-op Plan:   Post-operative Plan:   Informed Consent: I have reviewed the patients History and Physical, chart, labs and discussed the procedure including the risks, benefits and alternatives for the proposed anesthesia with the patient or authorized representative who has indicated his/her understanding and acceptance.       Plan Discussed with: Anesthesiologist  Anesthesia Plan Comments:         Anesthesia Quick Evaluation  

## 2017-08-24 NOTE — Progress Notes (Signed)
Dr Macon LargeAnyanwu called and informed that Patty Atkins presents to [facility] at 38w 1d complaining of ucs.  [facility] and was d/c due to no cervical change.  Patty Atkins is now 4/80/-2 and fetal heart is reactive and reassuring.  Orders to transfer [facility]kins to [facility]

## 2017-08-24 NOTE — Progress Notes (Signed)
Patient ID: Patty Atkins, female   DOB: 1990/08/07, 27 y.o.   MRN: [id number]  Feeling pressure; has epidural  BP 114/71, other VSS FHR 130-140s, +accels, no decels Ctx q 2-4 mins w/ Pit @ 312mu/min Cx per RN 8/90/-1 @ 2105  IUP@term  Active labor  Await urge to push, or check cx in 2hrs Anticipate SVD  Cam HaiSHAW, KIMBERLY CNM 08/24/2017

## 2017-08-24 NOTE — Anesthesia Pain Management Evaluation Note (Signed)
  CRNA Pain Management Visit Note  Patient: Patty Atkins, 27 y.o., female  "Hello I am a member of the anesthesia team at [facility]. We have an anesthesia team available at all times to provide care throughout the hospital, including epidural management and anesthesia for C-section. I don't know your plan for the delivery whether it a natural birth, water birth, IV sedation, nitrous supplementation, doula or epidural, but we want to meet your pain goals."   1.Was your pain managed to your expectations on prior hospitalizations?   Yes   2.What is your expectation for pain management during this hospitalization?     Labor support without medications and IV pain meds  3.How can we help you reach that goal? IV pain rx  Record the patient's initial score and the patient's pain goal.   Pain: 8  Pain Goal: 8 [facility] wants you to be able to say your pain was always managed very well.  Patty Atkins 08/24/2017

## 2017-08-24 NOTE — Anesthesia Procedure Notes (Signed)
Epidural Patient location during procedure: OB Start time: 08/24/2017 5:18 PM  Staffing Anesthesiologist: Mal AmabileFoster, Pleasant Britz, MD  Preanesthetic Checklist Completed: patient identified, site marked, surgical consent, pre-op evaluation, timeout performed, IV checked, risks and benefits discussed and monitors and equipment checked  Epidural Patient position: sitting Prep: site prepped and draped and DuraPrep Patient monitoring: continuous pulse ox and blood pressure Approach: midline Location: L3-L4 Injection technique: LOR air  Needle:  Needle type: Tuohy  Needle gauge: 17 G Needle length: 9 cm and 9 Needle insertion depth: 5 cm cm Catheter type: closed end flexible Catheter size: 19 Gauge Catheter at skin depth: 10 cm Test dose: negative and Other  Assessment Events: blood not aspirated, injection not painful, no injection resistance, negative IV test and no paresthesia  Additional Notes Patient identified. Risks and benefits discussed including failed block, incomplete  Pain control, post dural puncture headache, nerve damage, paralysis, blood pressure Changes, nausea, vomiting, reactions to medications-both toxic and allergic and post Partum back pain. All questions were answered. Patient expressed understanding and wished to proceed. Sterile technique was used throughout procedure. Epidural site was Dressed with sterile barrier dressing. No paresthesias, signs of intravascular injection Or signs of intrathecal spread were encountered.  Patient was more comfortable after the epidural was dosed. Please see RN's note for documentation of vital signs and FHR which are stable.

## 2017-08-24 NOTE — MAU Note (Deleted)
Pt states felt SROM around 2200 last night. Contractions started right after that.

## 2017-08-24 NOTE — ED Notes (Signed)
ED Provider at bedside. 

## 2017-08-24 NOTE — H&P (Signed)
Obstetric History and Physical  Patty Atkins is a 28 y.o. G3P1011 with IUP at [redacted]w[redacted]d presenting for SOL (early labor). Patient states she has been having  Regular contractions, minimal vaginal bleeding, intact membranes, with active fetal movement.    Prenatal Course Source of Care: [facility] Dating: By LMP --->  Estimated Date of Delivery: 09/06/17 Pregnancy complications or risks: Patient Active Problem List   Diagnosis Date Noted  . Pregnant 08/24/2017  . Supervision of other normal pregnancy, antepartum 03/06/2017   She plans to breastfeed She desires Depo-Provera for postpartum contraception.   Sono:    @[redacted]w[redacted]d , CWD, normal anatomy, cephalic presentation, posterior placenta, 2661g, 72% EFW  Prenatal labs and studies: ABO, Rh: A/Positive/-- (09/04 1005) Antibody: Positive, See Final Results (09/04 1005) Rubella: 2.57 (09/04 1005) RPR: Non Reactive (12/17 1203)  HBsAg: Negative (09/04 1005)  HIV: Non Reactive (12/17 1203)  ZOX:WRUEAVWU (02/13 1313) 2 hr Glucola  normal Genetic screening not done Anatomy US normal  Prenatal Transfer Tool  Maternal Diabetes: No Genetic Screening: Declined Maternal Ultrasounds/Referrals: Normal Fetal Ultrasounds or other Referrals:  None Maternal Substance Abuse:  No Significant Maternal Medications:  None Significant Maternal Lab Results: Lab values include: Group B Strep positive  Past Medical History:  Diagnosis Date  . Anemia     Past Surgical History:  Procedure Laterality Date  . DILATION AND CURETTAGE OF UTERUS    . HERNIA REPAIR      OB History  Gravida Para Term Preterm AB Living  3 1 1   1 1   SAB TAB Ectopic Multiple Live Births    1     1    # Outcome Date GA Lbr Len/2nd Weight Sex Delivery Anes PTL Lv  3 Current           2 TAB 08/03/16          1 Term 05/09/13 [redacted]w[redacted]d  3.941 kg (8 lb 11 oz) M Vag-Spont EPI  LIV      Social History   Socioeconomic History  . Marital status: Single    Spouse name: None  .  Number of children: None  . Years of education: None  . Highest education level: None  Social Needs  . Financial resource strain: None  . Food insecurity - worry: None  . Food insecurity - inability: None  . Transportation needs - medical: None  . Transportation needs - non-medical: None  Occupational History  . None  Tobacco Use  . Smoking status: Former Games developer  . Smokeless tobacco: Never Used  Substance and Sexual Activity  . Alcohol use: No  . Drug use: No  . Sexual activity: Yes    Birth control/protection: None  Other Topics Concern  . None  Social History Narrative  . None    Family History  Problem Relation Age of Onset  . Diabetes Maternal Grandmother     Medications Prior to Admission  Medication Sig Dispense Refill Last Dose  . ferrous sulfate (FEROSUL) 220 (44 Fe) MG/5ML solution Take 5 mLs (220 mg total) by mouth 2 (two) times daily with a meal. 473 mL 0 08/23/2017 at Unknown time  . hydrocortisone (ANUSOL-HC) 25 MG suppository Place 1 suppository (25 mg total) rectally 2 (two) times daily. 12 suppository 11 08/23/2017 at Unknown time  . Prenatal Vit-Fe Fumarate-FA (PRENATAL MULTIVITAMIN) TABS tablet Take 1 tablet by mouth daily at 12 noon.   08/23/2017 at Unknown time    No Known Allergies  Review of Systems: Negative except for  what is mentioned in HPI.  Physical Exam: BP 116/84   Pulse (!) 103   Temp 98.5 F (36.9 C) (Oral)   Resp 16   LMP 11/30/2016 (Exact Date)   SpO2 100%  CONSTITUTIONAL: Well-developed, well-nourished female in no acute distress.  HENT:  Normocephalic, atraumatic, External right and left ear normal. Oropharynx is clear and moist EYES: Conjunctivae and EOM are normal. Pupils are equal, round, and reactive to light. No scleral icterus.  NECK: Normal range of motion, supple, no masses SKIN: Skin is warm and dry. No rash noted. Not diaphoretic. No erythema. No pallor. NEUROLOGIC: Alert and oriented to person, place, and time. Normal  reflexes, muscle tone coordination. No cranial nerve deficit noted. PSYCHIATRIC: Normal mood and affect. Normal behavior. Normal judgment and thought content. CARDIOVASCULAR: Normal heart rate noted, regular rhythm RESPIRATORY: Effort and breath sounds normal, no problems with respiration noted ABDOMEN: Soft, nontender, nondistended, gravid. MUSCULOSKELETAL: Normal range of motion. No edema and no tenderness. 2+ distal pulses.  Cervical Exam: Dilatation 4cm   Effacement 80%   Station -2  Presentation: cephalic FHT:  Baseline rate 125 bpm   Variability moderate  Accelerations present   Decelerations none Contractions: Every 2-5 mins   Pertinent Labs/Studies:   Results for orders placed or performed during the hospital encounter of 08/23/17 (from the past 24 hour(s))  Fern Test     Status: Normal   Collection Time: 08/24/17 12:48 AM  Result Value Ref Range   POCT Fern Test Negative = intact amniotic membranes     Assessment : Patty Atkins is a 27 y.o. G3P1011 at 2939w1d being admitted for early labor.  Plan: Labor: Expectant management. Augmentation as needed. Analgesia as needed. FWB: Reassuring fetal heart tracing. GBS positive - start PCN Delivery plan: Hopeful for vaginal delivery   Caryl AdaJazma Phelps, DO OB Fellow Faculty Practice, [facility] 08/24/2017, 12:49 PM

## 2017-08-24 NOTE — MAU Note (Signed)
Incorrect information entered on patient due to error in charting by other RN.  Patient not currently complaining of any leaking of fluid.

## 2017-08-24 NOTE — Progress Notes (Signed)
Labor Progress Note  Patty Atkins is a 27 y.o. G3P1011 at 2059w1d  admitted for early labor  S: Comfortable. Not really feeling contractions.   O:  BP (!) 112/52   Pulse 85   Temp 98.3 F (36.8 C) (Oral)   Resp 17   LMP 11/30/2016 (Exact Date)   SpO2 100%   No intake/output data recorded.  FHT:  FHR: 130 bpm, variability: moderate,  accelerations:  Present,  decelerations:  Absent UC:   irregular, every 6-10 minutes SVE:   Dilation: 6 Effacement (%): 70 Station: -2 Exam by:: Patty Atkins  Membranes intact   Labs: Lab Results  Component Value Date   WBC 6.3 08/24/2017   HGB 10.0 (L) 08/24/2017   HCT 30.0 (L) 08/24/2017   MCV 74.6 (L) 08/24/2017   PLT 198 08/24/2017    Assessment / Plan: 27 y.o. G3P1011 5259w1d in early labor Spontaneous labor, progressing normally  Labor: Progressing normally, will augment with pitocin since not making much change and contractions are inadequate Fetal Wellbeing:  Category I Pain Control:  Labor support without medications Anticipated MOD:  NSVD  Expectant management   Patty AdaJazma Kadance Mccuistion, DO OB Fellow Center for [facility], [facility]

## 2017-08-24 NOTE — Discharge Instructions (Signed)
Braxton Hicks Contractions °Contractions of the uterus can occur throughout pregnancy, but they are not always a sign that you are in labor. You may have practice contractions called Braxton Hicks contractions. These false labor contractions are sometimes confused with true labor. °What are Braxton Hicks contractions? °Braxton Hicks contractions are tightening movements that occur in the muscles of the uterus before labor. Unlike true labor contractions, these contractions do not result in opening (dilation) and thinning of the cervix. Toward the end of pregnancy (32-34 weeks), Braxton Hicks contractions can happen more often and may become stronger. These contractions are sometimes difficult to tell apart from true labor because they can be very uncomfortable. You should not feel embarrassed if you go to the hospital with false labor. °Sometimes, the only way to tell if you are in true labor is for your health care provider to look for changes in the cervix. The health care provider will do a physical exam and may monitor your contractions. If you are not in true labor, the exam should show that your cervix is not dilating and your water has not broken. °If there are other health problems associated with your pregnancy, it is completely safe for you to be sent home with false labor. You may continue to have Braxton Hicks contractions until you go into true labor. °How to tell the difference between true labor and false labor °True labor °· Contractions last 30-70 seconds. °· Contractions become very regular. °· Discomfort is usually felt in the top of the uterus, and it spreads to the lower abdomen and low back. °· Contractions do not go away with walking. °· Contractions usually become more intense and increase in frequency. °· The cervix dilates and gets thinner. °False labor °· Contractions are usually shorter and not as strong as true labor contractions. °· Contractions are usually irregular. °· Contractions  are often felt in the front of the lower abdomen and in the groin. °· Contractions may go away when you walk around or change positions while lying down. °· Contractions get weaker and are shorter-lasting as time goes on. °· The cervix usually does not dilate or become thin. °Follow these instructions at home: °· Take over-the-counter and prescription medicines only as told by your health care provider. °· Keep up with your usual exercises and follow other instructions from your health care provider. °· Eat and drink lightly if you think you are going into labor. °· If Braxton Hicks contractions are making you uncomfortable: °? Change your position from lying down or resting to walking, or change from walking to resting. °? Sit and rest in a tub of warm water. °? Drink enough fluid to keep your urine pale yellow. Dehydration may cause these contractions. °? Do slow and deep breathing several times an hour. °· Keep all follow-up prenatal visits as told by your health care provider. This is important. °Contact a health care provider if: °· You have a fever. °· You have continuous pain in your abdomen. °Get help right away if: °· Your contractions become stronger, more regular, and closer together. °· You have fluid leaking or gushing from your vagina. °· You pass blood-tinged mucus (bloody show). °· You have bleeding from your vagina. °· You have low back pain that you never had before. °· You feel your baby’s head pushing down and causing pelvic pressure. °· Your baby is not moving inside you as much as it used to. °Summary °· Contractions that occur before labor are called Braxton   Hicks contractions, false labor, or practice contractions. °· Braxton Hicks contractions are usually shorter, weaker, farther apart, and less regular than true labor contractions. True labor contractions usually become progressively stronger and regular and they become more frequent. °· Manage discomfort from Braxton Hicks contractions by  changing position, resting in a warm bath, drinking plenty of water, or practicing deep breathing. °This information is not intended to replace advice given to you by your health care provider. Make sure you discuss any questions you have with your health care provider. °Document Released: 11/02/2016 Document Revised: 11/02/2016 Document Reviewed: 11/02/2016 °Elsevier Interactive Patient Education © 2018 [employer] ° °

## 2017-08-25 ENCOUNTER — Other Ambulatory Visit: Payer: Self-pay

## 2017-08-25 ENCOUNTER — Encounter (HOSPITAL_COMMUNITY): Payer: Self-pay | Admitting: *Deleted

## 2017-08-25 DIAGNOSIS — Z3A38 38 weeks gestation of pregnancy: Secondary | ICD-10-CM

## 2017-08-25 DIAGNOSIS — O99824 Streptococcus B carrier state complicating childbirth: Secondary | ICD-10-CM

## 2017-08-25 LAB — RPR: RPR: NONREACTIVE

## 2017-08-25 MED ORDER — IBUPROFEN 600 MG PO TABS
600.0000 mg | ORAL_TABLET | Freq: Four times a day (QID) | ORAL | Status: DC
  Administered 2017-08-25 – 2017-08-27 (×8): 600 mg via ORAL
  Filled 2017-08-25 (×10): qty 1

## 2017-08-25 MED ORDER — SENNOSIDES-DOCUSATE SODIUM 8.6-50 MG PO TABS
2.0000 | ORAL_TABLET | ORAL | Status: DC
  Administered 2017-08-25: 2 via ORAL
  Filled 2017-08-25 (×2): qty 2

## 2017-08-25 MED ORDER — TETANUS-DIPHTH-ACELL PERTUSSIS 5-2.5-18.5 LF-MCG/0.5 IM SUSP: 0.5000 mL | Freq: Once | INTRAMUSCULAR | Status: DC

## 2017-08-25 MED ORDER — ZOLPIDEM TARTRATE 5 MG PO TABS: 5.0000 mg | ORAL_TABLET | Freq: Every evening | ORAL | Status: DC | PRN

## 2017-08-25 MED ORDER — BENZOCAINE-MENTHOL 20-0.5 % EX AERO
1.0000 "application " | INHALATION_SPRAY | CUTANEOUS | Status: DC | PRN
  Filled 2017-08-25 (×2): qty 56

## 2017-08-25 MED ORDER — MEDROXYPROGESTERONE ACETATE 150 MG/ML IM SUSP
150.0000 mg | Freq: Once | INTRAMUSCULAR | Status: AC
  Administered 2017-08-27: 150 mg via INTRAMUSCULAR
  Filled 2017-08-25: qty 1

## 2017-08-25 MED ORDER — DIPHENHYDRAMINE HCL 25 MG PO CAPS: 25.0000 mg | ORAL_CAPSULE | Freq: Four times a day (QID) | ORAL | Status: DC | PRN

## 2017-08-25 MED ORDER — COCONUT OIL OIL
1.0000 "application " | TOPICAL_OIL | Status: DC | PRN
  Administered 2017-08-25 – 2017-08-27 (×2): 1 via TOPICAL
  Filled 2017-08-25 (×2): qty 120

## 2017-08-25 MED ORDER — SIMETHICONE 80 MG PO CHEW: 80.0000 mg | CHEWABLE_TABLET | ORAL | Status: DC | PRN

## 2017-08-25 MED ORDER — ACETAMINOPHEN 325 MG PO TABS
650.0000 mg | ORAL_TABLET | ORAL | Status: DC | PRN
  Administered 2017-08-26 (×2): 650 mg via ORAL
  Filled 2017-08-25 (×3): qty 2

## 2017-08-25 MED ORDER — DIBUCAINE 1 % RE OINT
1.0000 "application " | TOPICAL_OINTMENT | RECTAL | Status: DC | PRN
  Administered 2017-08-26: 1 via RECTAL
  Filled 2017-08-25 (×2): qty 28

## 2017-08-25 MED ORDER — WITCH HAZEL-GLYCERIN EX PADS: 1.0000 "application " | MEDICATED_PAD | CUTANEOUS | Status: DC | PRN

## 2017-08-25 MED ORDER — ONDANSETRON HCL 4 MG PO TABS: 4.0000 mg | ORAL_TABLET | ORAL | Status: DC | PRN

## 2017-08-25 MED ORDER — ONDANSETRON HCL 4 MG/2ML IJ SOLN: 4.0000 mg | INTRAMUSCULAR | Status: DC | PRN

## 2017-08-25 MED ORDER — PRENATAL MULTIVITAMIN CH
1.0000 | ORAL_TABLET | Freq: Every day | ORAL | Status: DC
  Administered 2017-08-25 – 2017-08-27 (×3): 1 via ORAL
  Filled 2017-08-25 (×3): qty 1

## 2017-08-25 NOTE — Anesthesia Postprocedure Evaluation (Signed)
Anesthesia Post Note  Patient: Patty Atkins  Procedure(s) Performed: AN AD HOC LABOR EPIDURAL     Anesthesia Type: Epidural Level of consciousness: awake and alert Pain management: pain level controlled Vital Signs Assessment: post-procedure vital signs reviewed and stable Respiratory status: spontaneous breathing Cardiovascular status: stable Postop Assessment: no headache, patient able to bend at knees, no backache, no apparent nausea or vomiting, epidural receding and adequate PO intake    Last Vitals:  Vitals:   08/25/17 0318 08/25/17 0715  BP: (!) 104/58 (!) 108/45  Pulse: 81 72  Resp: 18 18  Temp: 36.8 C 36.8 C  SpO2:  100%    Last Pain:  Vitals:   08/25/17 0715  TempSrc: Oral  PainSc: 0-No pain   Pain Goal: Patients Stated Pain Goal: 3 (08/25/17 0715)               Salome ArntSterling, Branon Sabine Marie

## 2017-08-25 NOTE — Lactation Note (Signed)
This note was copied from a baby's chart. Lactation Consultation Note  Patient Name: Patty Atkins BihariCurnisha Friedly ZOXWR'UToday's Date: 08/25/2017 Reason for consult: Initial assessment;Early term 37-38.6wks Breastfeeding consultation services and support information given to mom.  This is her second baby and she pumped and bottle fed x 6 months with her first.  Mom states baby is latching well to right breast but left nipple is sore.  Asking about a nipple shield.  Recommended she [facility] for [facility] with next feeding.  Maternal Data Does the patient have breastfeeding experience prior to this delivery?: Yes  Feeding Feeding Type: Breast Fed Length of feed: 10 min  LATCH Score                   Interventions    Lactation Tools Discussed/Used WIC Program: No Pump Review: Setup, frequency, and cleaning;Milk Storage Initiated by:: Glory RosebushBriana Williams, RN [phone] Date initiated:: 08/25/17   Consult Status Consult Status: Follow-up Date: 08/26/17 Follow-up type: In-patient    Huston FoleyMOULDEN, Essie Lagunes S 08/25/2017, 11:53 AM

## 2017-08-25 NOTE — Progress Notes (Signed)
POSTPARTUM PROGRESS NOTE  Post Partum Day #0 Subjective:  Patty Atkins is a 27 y.o. [id number] 7469w2d s/p SVD.  No acute events overnight.  Pt denies problems with ambulating, voiding or po intake.  She denies nausea or vomiting.  Pain is well controlled. Lochia Moderate.   Objective: Blood pressure (!) 104/58, pulse 81, temperature 98.2 F (36.8 C), temperature source Oral, resp. rate 18, last menstrual period 11/30/2016, SpO2 100 %, unknown if currently breastfeeding.  Physical Exam:  General: alert, cooperative and no distress Lochia:normal flow Chest: no respiratory distress Heart:regular rate, distal pulses intact Abdomen: soft, nontender,  Uterine Fundus: firm, appropriately tender DVT Evaluation: No calf swelling or tenderness Extremities: no edema  Recent Labs    08/24/17 1303  HGB 10.0*  HCT 30.0*    Assessment/Plan:  ASSESSMENT: Patty Atkins is a 27 y.o. [id number] 2569w2d s/p SVD  Breastfeeding going well. Contraception: Depo prior to d/c Plan for discharge tomorrow.   LOS: 1 day   Kingsley Spittlelison RumballDO 08/25/2017, 6:52 AM

## 2017-08-26 MED ORDER — OXYCODONE HCL 5 MG PO TABS
5.0000 mg | ORAL_TABLET | Freq: Once | ORAL | Status: AC
Start: 2017-08-26 — End: 2017-08-26
  Administered 2017-08-26: 5 mg via ORAL
  Filled 2017-08-26: qty 1

## 2017-08-26 NOTE — Progress Notes (Addendum)
POSTPARTUM PROGRESS NOTE  Post Partum Day 1 Subjective:  Patty Atkins is a 27 y.o. [id number] 4086w2d s/p SVD.  No acute events overnight.  Pt denies problems with ambulating, voiding or po intake.  She denies nausea or vomiting.  Patient reporting perineal pain not well controlled with Motrin.  Lochia Small.   Objective: Blood pressure 118/70, pulse 85, temperature 98 F (36.7 C), temperature source Oral, resp. rate 18, height 5\' 8"  (1.727 m), weight 249 lb (112.9 kg), last menstrual period 11/30/2016, SpO2 99 %, unknown if currently breastfeeding.  Physical Exam:  General: alert, cooperative and no distress Lochia:normal flow Chest: no respiratory distress Heart:regular rate, distal pulses intact Abdomen: soft, nontender,  Uterine Fundus: firm, appropriately tender DVT Evaluation: No calf swelling or tenderness Extremities: no edema Pelvic: perineal area well approximated with no signs of infection.  Recent Labs    08/24/17 1303  HGB 10.0*  HCT 30.0*    Assessment/Plan:  ASSESSMENT: Patty BihariCurnisha Rahimi is a 27 y.o. [id number] 7086w2d s/p SVD.  Plan for discharge tomorrow Breastfeeding with some Bottle supplementation Pain: give one dose Oxy IR. Continue to monitor.   LOS: 2 days   Ellwood DenseAlison Rumball, DO 08/26/2017, 6:14 AM   OB FELLOW POSTPARTUM PROGRESS NOTE ATTESTATION I have seen and examined this patient and agree with above documentation in the resident's note.   PPD#1 - Doing well. VS wnl.  Contraception: Depo Feeding: breast Dispo: Plan for discharge tomorrow; baby on phototherapy  Frederik PearJulie P Degele, MD OB Fellow 7:42 AM

## 2017-08-27 NOTE — Progress Notes (Signed)
This nurse asked patient if she had support when she went home and if she would like to speak with a Child psychotherapistsocial worker for resources; patient stated she is working with a Child psychotherapistsocial worker and that at this time she believes that will be sufficient.

## 2017-08-27 NOTE — Progress Notes (Signed)
Patient moved to new room (126) because of less-than-desirable things found in shower.

## 2017-08-27 NOTE — Discharge Summary (Addendum)
OB Discharge Summary     Patient Name: Patty BihariCurnisha Sylvester DOB: February 02, 1991 MRN: [id number]  Date of admission: 08/24/2017 Delivering MD: Cam HaiSHAW, KIMBERLY D   Date of discharge: 08/27/2017  Admitting diagnosis: Active preterm labor, single or unspecified fetus [O60.10X0] Intrauterine pregnancy: 6642w2d     Secondary diagnosis:  Active Problems:   Pregnant  Additional problems: GBS pos     Discharge diagnosis: Term Pregnancy Delivered                                                                                                Post partum procedures:none  Augmentation: AROM and Pitocin  Complications: None  Hospital course:  Onset of Labor With Vaginal Delivery     27 y.o. yo [id number] at 1442w2d was admitted in Latent Labor on 08/24/2017. Patient had an uncomplicated labor course as follows:  Membrane Rupture Time/Date: 5:52 PM ,08/24/2017   Intrapartum Procedures: Episiotomy: None [1]                                         Lacerations:  2nd degree [3];Perineal [11];Labial [10]  Patient had a delivery of a Viable infant. 08/25/2017  Information for the patient's newborn:  Solon AugustaChapman, Girl Kenya [[id number]][[id number]]  Delivery Method: Vag-Spont    Pateint had an uncomplicated postpartum course.  She is ambulating, tolerating a regular diet, passing flatus, and urinating well. Patient is discharged home in stable condition on 08/27/17.   Physical exam  Vitals:   08/25/17 1744 08/26/17 0500 08/26/17 1818 08/27/17 0622  BP: 110/66 118/70 123/73 117/70  Pulse: 79 85 70 64  Resp: 18 18 19 16   Temp: 98.6 F (37 C) 98 F (36.7 C) 98.5 F (36.9 C) 97.7 F (36.5 C)  TempSrc: Oral Oral Oral Oral  SpO2:  99% 100% 97%  Weight:      Height:       General: alert, cooperative and no distress Lochia: appropriate Uterine Fundus: firm Incision: N/A DVT Evaluation: No evidence of DVT seen on physical exam. Negative Homan's sign. No cords or calf tenderness. Labs: Lab Results  Component Value  Date   WBC 6.3 08/24/2017   HGB 10.0 (L) 08/24/2017   HCT 30.0 (L) 08/24/2017   MCV 74.6 (L) 08/24/2017   PLT 198 08/24/2017   CMP Latest Ref Rng & Units 07/30/2016  Glucose 65 - 99 mg/dL 95  BUN 6 - 20 mg/dL 8  Creatinine 1.300.44 - 8.651.00 mg/dL 7.840.57  Sodium 696135 - 295145 mmol/L 133(L)  Potassium 3.5 - 5.1 mmol/L 3.3(L)  Chloride 101 - 111 mmol/L 102  CO2 22 - 32 mmol/L 24  Calcium 8.9 - 10.3 mg/dL 8.9  Total Protein 6.5 - 8.1 g/dL 7.8  Total Bilirubin 0.3 - 1.2 mg/dL 0.6  Alkaline Phos 38 - 126 U/L 38  AST 15 - 41 U/L 15  ALT 14 - 54 U/L 14    Discharge instruction: per After Visit Summary and "Baby and Me Booklet".  After visit meds:  Allergies as  of 08/27/2017   No Known Allergies     Medication List    STOP taking these medications   hydrocortisone 25 MG suppository Commonly known as:  ANUSOL-HC     TAKE these medications   ferrous sulfate 220 (44 Fe) MG/5ML solution Commonly known as:  FEROSUL Take 5 mLs (220 mg total) by mouth 2 (two) times daily with a meal.   prenatal multivitamin Tabs tablet Take 1 tablet by mouth daily at 12 noon.       Diet: routine diet  Activity: Advance as tolerated. Pelvic rest for 6 weeks.   Outpatient follow up:4 weeks Follow up Appt: Future Appointments  Date Time Provider Department Center  08/29/2017 11:15 AM Allie Bossier, MD [facility] None   Follow up Visit:No Follow-up on file.  Postpartum contraception: Depo Provera  Newborn Data: Live born female  Birth Weight: 8 lb 5.7 oz (3790 g) APGAR: 9, 9  Newborn Delivery   Birth date/time:  08/25/2017 00:26:00 Delivery type:  Vaginal, Spontaneous     Baby Feeding: Bottle and Breast Disposition:home with mother   08/27/2017 Claudine Mouton, Student-PA   I confirm that I have verified the information documented in the physician assistant's 's note and that I have also personally reperformed the physical exam and all medical decision making activities.  Sharyon Cable,  CNM 08/27/17, 7:53 AM

## 2017-08-27 NOTE — Lactation Note (Signed)
This note was copied from a baby's chart. Lactation Consultation Note  Patient Name: Girl Rafael BihariCurnisha Newcombe ZOXWR'UToday's Date: 08/27/2017 Reason for consult: Follow-up assessment   Mother has been pumping and breastfeeding. She pumped 20 ml this morning. Her nipples have cracks and have bled. Encouraged deep latch. Provided mother w/ comfort gels and shells with instructions to alternate. Mom encouraged to feed baby 8-12 times/24 hours and with feeding cues.  Reviewed engorgement care and monitoring voids/stools.    Maternal Data    Feeding Feeding Type: Bottle Fed - Formula  LATCH Score                   Interventions Interventions: Comfort gels;Shells;Coconut oil;Expressed milk  Lactation Tools Discussed/Used Tools: Shells;Coconut oil;Comfort gels   Consult Status Consult Status: Complete    Hardie PulleyBerkelhammer, Ruth Boschen 08/27/2017, 12:38 PM

## 2017-08-28 ENCOUNTER — Ambulatory Visit: Payer: Self-pay

## 2017-08-28 NOTE — Lactation Note (Signed)
This note was copied from a baby's chart. Lactation Consultation Note  Patient Name: Patty [name] ZOXWR'UToday's Date: 08/28/2017 Reason for consult: Follow-up assessment  Baby 83 hours old and on phototherapy.   Mother pumping 35 ml +. Mother is pumping only one breast at a time.   Encouraged her to pump both breasts to conserve time and energy and increase volume.   Provided mother with coconut oil for sore nipples. Left LC phone number and suggest she call if she needs help w/ latching today.    Maternal Data    Feeding    LATCH Score                   Interventions    Lactation Tools Discussed/Used     Consult Status Consult Status: Complete    Hardie PulleyBerkelhammer, Terrill Wauters Boschen 08/28/2017, 11:59 AM

## 2017-08-29 ENCOUNTER — Encounter: Payer: Medicaid Other | Admitting: Obstetrics & Gynecology

## 2017-09-24 ENCOUNTER — Ambulatory Visit: Payer: Medicaid Other | Admitting: Obstetrics

## 2017-09-28 ENCOUNTER — Telehealth: Payer: Self-pay | Admitting: *Deleted

## 2017-09-28 NOTE — Telephone Encounter (Signed)
Attempted to call pt to reschedule missed PP visit on 03/25, pt did not answer and vmail box is full unable to leave a message.

## 2018-04-14 ENCOUNTER — Inpatient Hospital Stay (HOSPITAL_COMMUNITY)
Admission: AD | Admit: 2018-04-14 | Discharge: 2018-04-14 | Discharge status: Against Medical Advice | Payer: Medicaid Other | Source: Ambulatory Visit | Attending: Obstetrics and Gynecology | Admitting: Obstetrics and Gynecology

## 2018-04-14 ENCOUNTER — Encounter (HOSPITAL_COMMUNITY): Payer: Self-pay

## 2018-04-14 DIAGNOSIS — Z5329 Procedure and treatment not carried out because of patient's decision for other reasons: Secondary | ICD-10-CM | POA: Insufficient documentation

## 2018-04-14 LAB — URINALYSIS, ROUTINE W REFLEX MICROSCOPIC
BACTERIA UA: NONE SEEN
Bilirubin Urine: NEGATIVE
GLUCOSE, UA: NEGATIVE mg/dL
Hgb urine dipstick: NEGATIVE
KETONES UR: NEGATIVE mg/dL
Leukocytes, UA: NEGATIVE
NITRITE: NEGATIVE
PH: 5 (ref 5.0–8.0)
PROTEIN: 30 mg/dL — AB
Specific Gravity, Urine: 1.028 (ref 1.005–1.030)

## 2018-04-14 LAB — POCT PREGNANCY, URINE: Preg Test, Ur: NEGATIVE

## 2018-04-14 NOTE — MAU Note (Signed)
Pt states she needs to leave because she has a class in 30 min. Has not been seen by CNM yet. Pt signs AMA paper and leaves unit.

## 2018-04-14 NOTE — MAU Note (Signed)
Checked cervix last night and states she felt a bump/boil on cervix  States she checks it every now and then since she delivered in Feb  No vag bleeding, no discharge

## 2018-05-11 IMAGING — US US OB TRANSVAGINAL
1 series · 15 of 28 positions shown · non-contrast
Comparison: None.

CLINICAL DATA: Vaginal bleeding and pelvic cramping beginning
today. Gestational age by LMP of 7 weeks 6 days.

EXAM:
OBSTETRIC <14 WK US AND TRANSVAGINAL OB US
TECHNIQUE: Both transabdominal and transvaginal ultrasound examinations were
performed for complete evaluation of the gestation as well as the
maternal uterus, adnexal regions, and pelvic cul-de-sac.
Transvaginal technique was performed to assess early pregnancy.

[Series 1: us ob transvaginal · 15 of 64 slices shown]
[im 1/64]
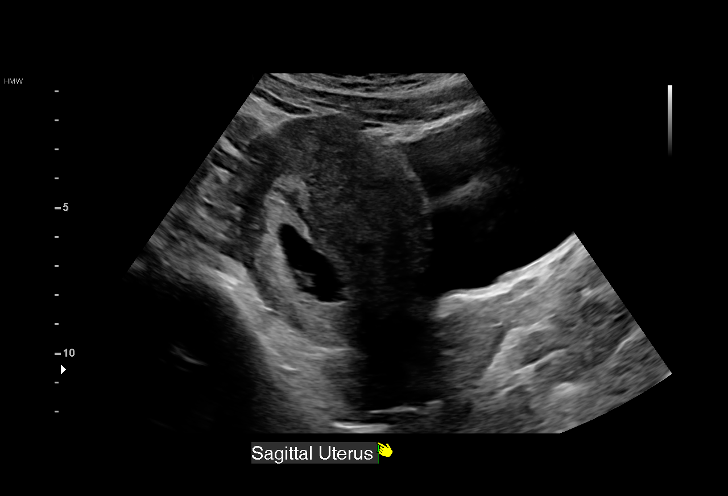
[im 5/64]
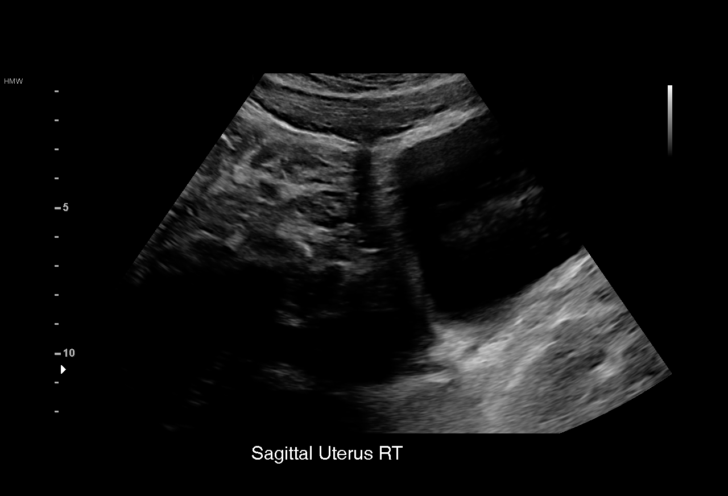
[im 10/64]
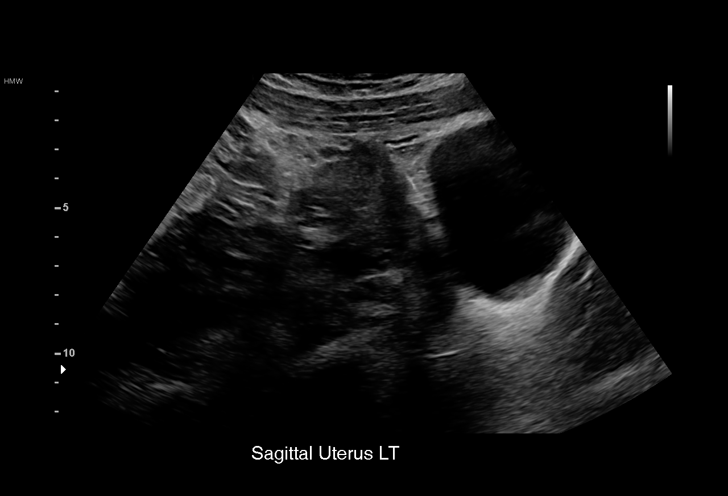
[im 15/64]
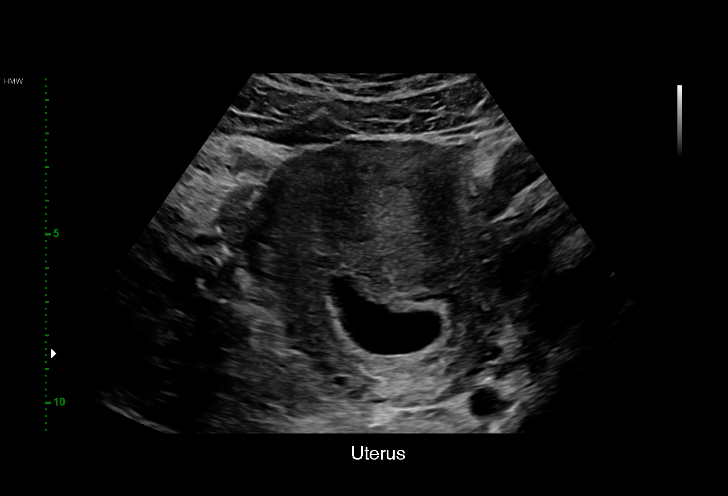
[im 19/64]
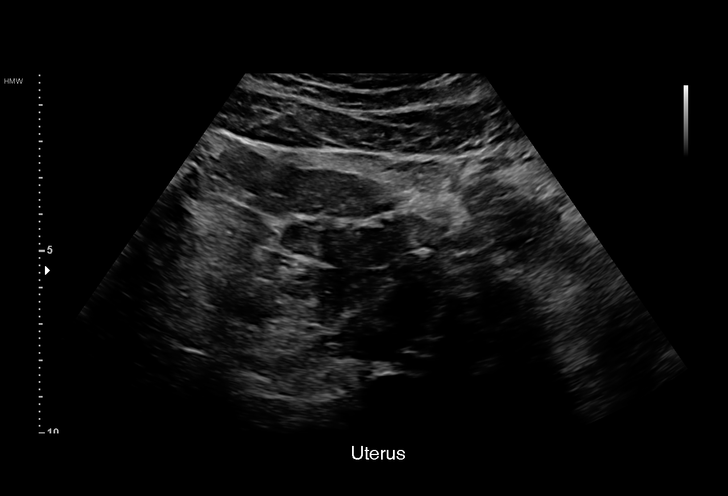
[im 24/64]
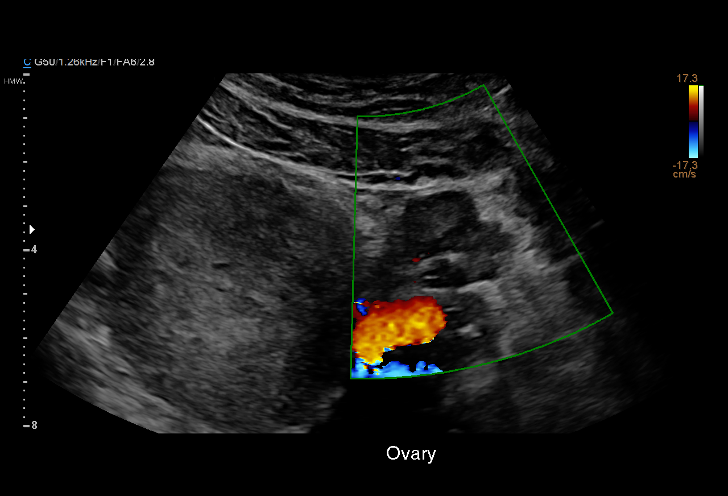
[im 29/64]
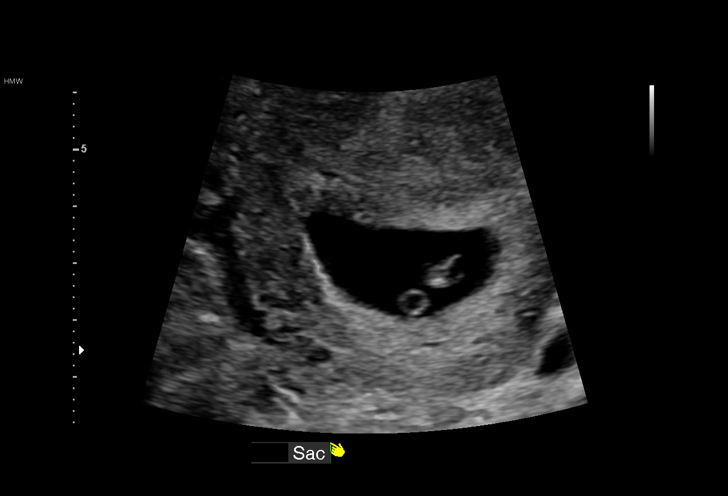
[im 33/64]
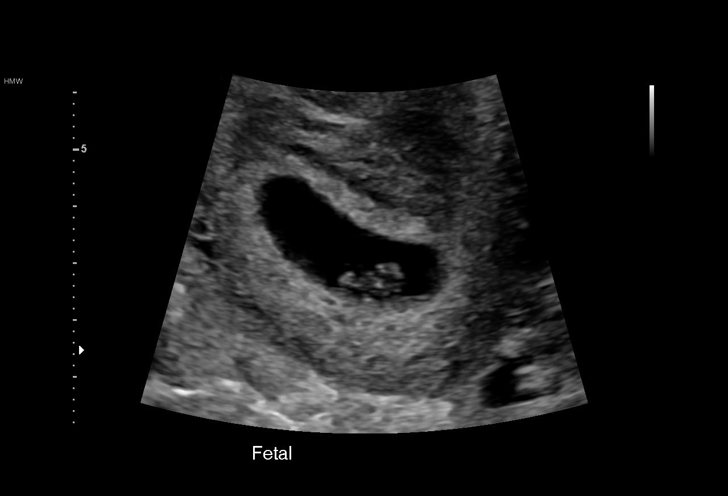
[im 36/64]
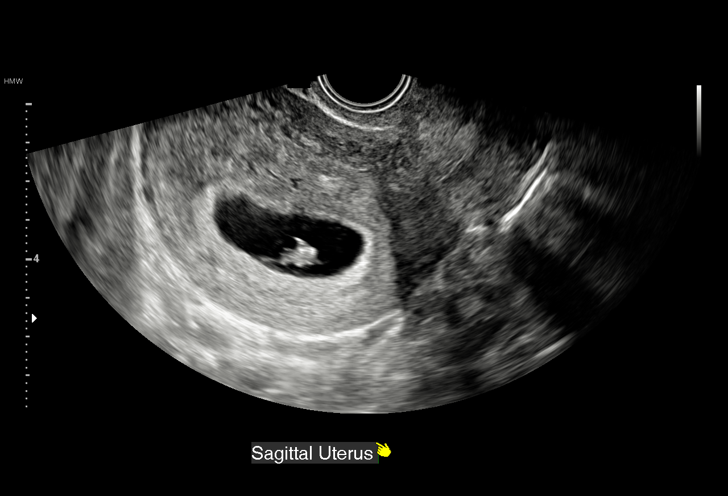
[im 40/64]
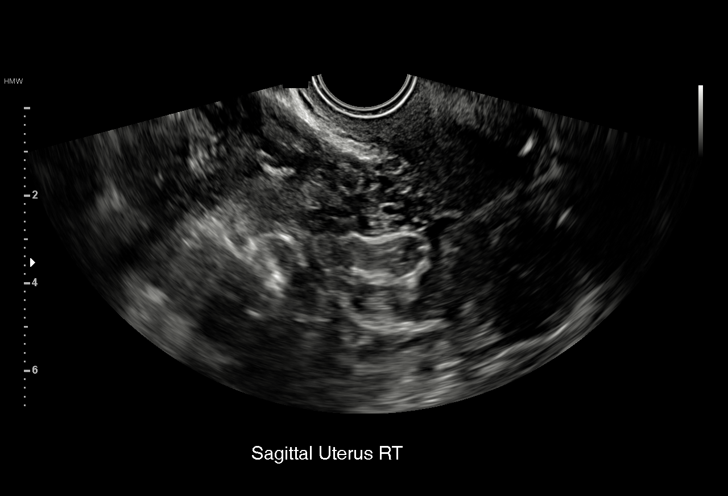
[im 45/64]
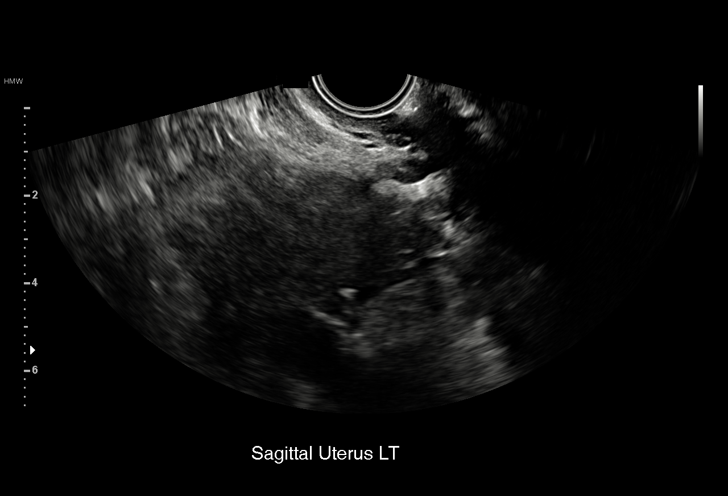
[im 50/64]
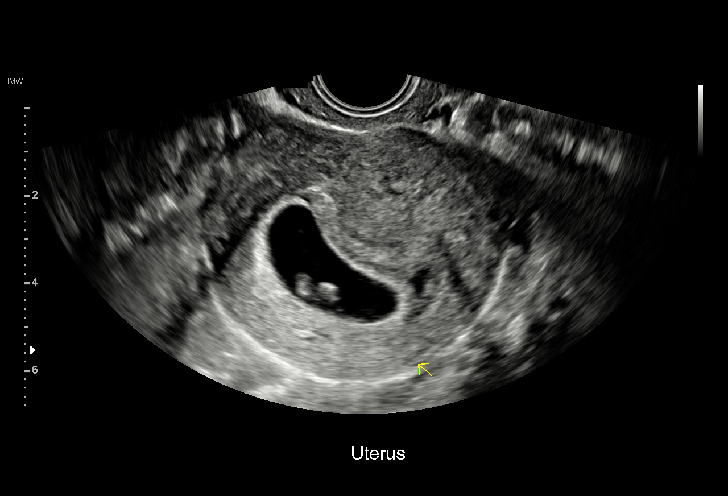
[im 54/64]
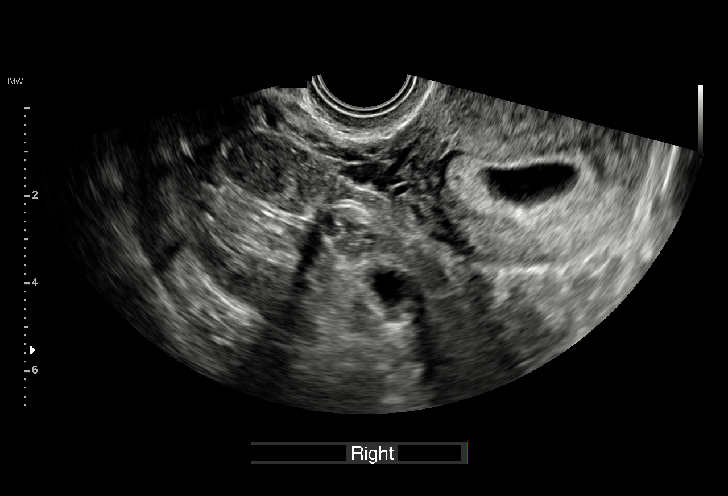
[im 59/64]
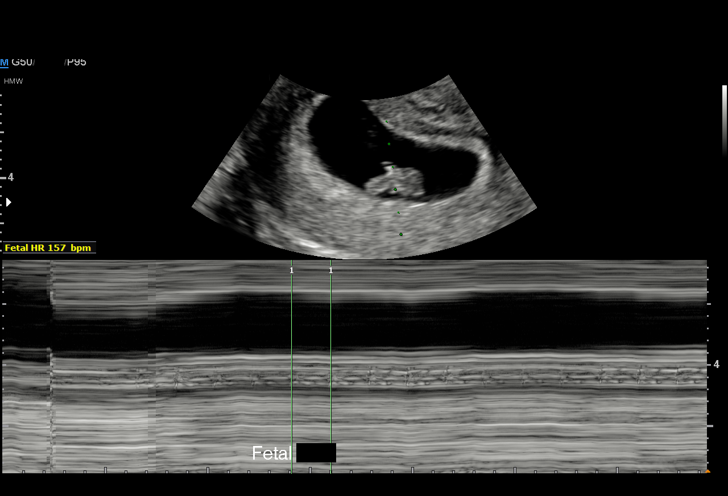
[im 64/64]
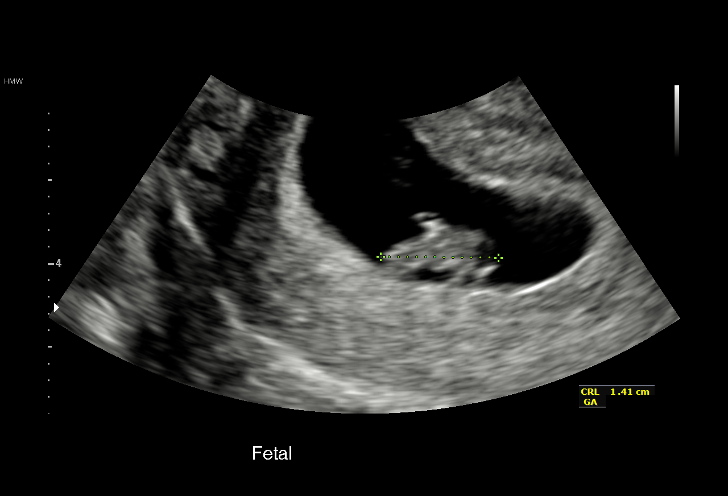

[15 of 28 positions shown; findings below may reference images not displayed]

FINDINGS: Intrauterine gestational sac: Single

Yolk sac:  Visualized.

Embryo:  Visualized.

Cardiac Activity: Visualized.

Heart Rate: 157  bpm

CRL:  14  mm   7 w   4 d                  US EDC: 09/08/2017

Subchorionic hemorrhage:  Small subchorionic hemorrhage noted.

Maternal uterus/adnexae: Normal appearance of left ovary. Right
ovary not directly visualized, however no adnexal mass or abnormal
free fluid identified.
IMPRESSION: Single living IUP measuring 7 weeks 4 days, with US EDC of
09/08/2017. This is concordant with LMP.

Small subchorionic hemorrhage noted.

## 2018-10-07 IMAGING — US US OB COMP LESS 14 WK
1 series · 14 of 25 positions shown · non-contrast
Comparison: None.

CLINICAL DATA: Initial evaluation for acute nausea, vomiting.
Currently pregnant.

EXAM:
OBSTETRIC <14 WK US AND TRANSVAGINAL OB US
TECHNIQUE: Both transabdominal and transvaginal ultrasound examinations were
performed for complete evaluation of the gestation as well as the
maternal uterus, adnexal regions, and pelvic cul-de-sac.
Transvaginal technique was performed to assess early pregnancy.

[Series 1: us ob comp less 14 wk · 0.23mm/px · 14 of 25 slices shown]
[im 1/25]
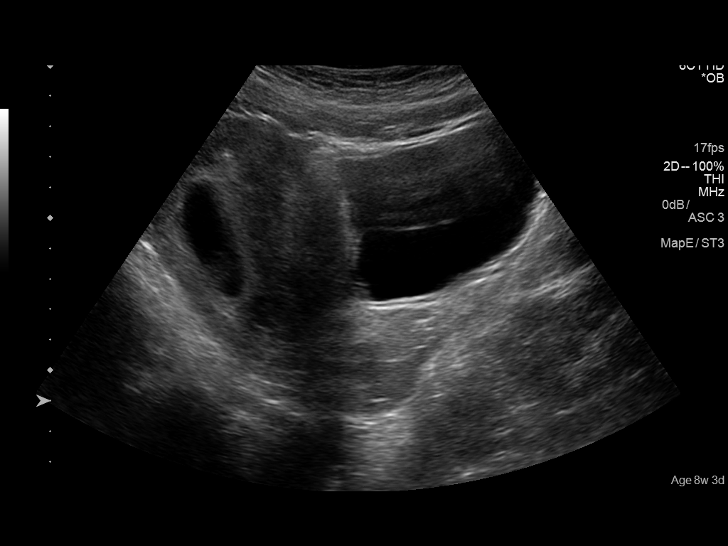
[im 3/25]
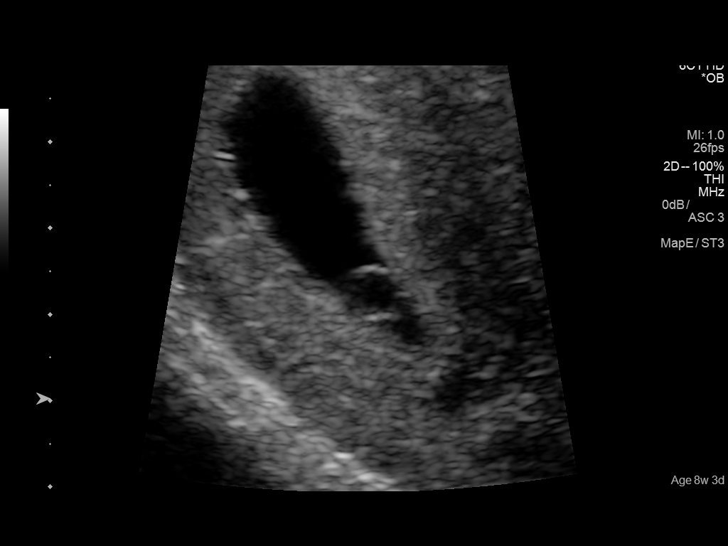
[im 5/25]
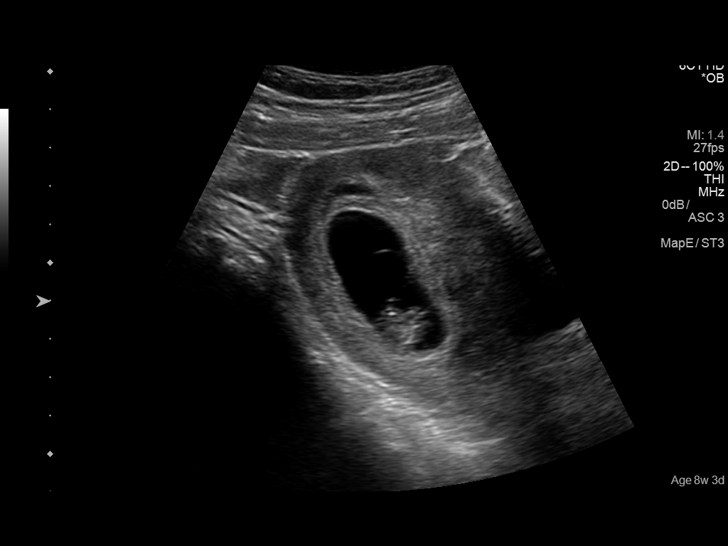
[im 7/25]
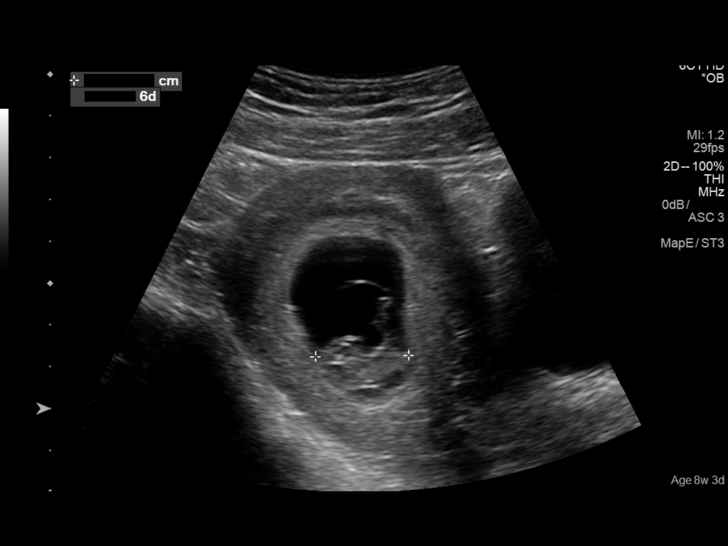
[im 9/25]
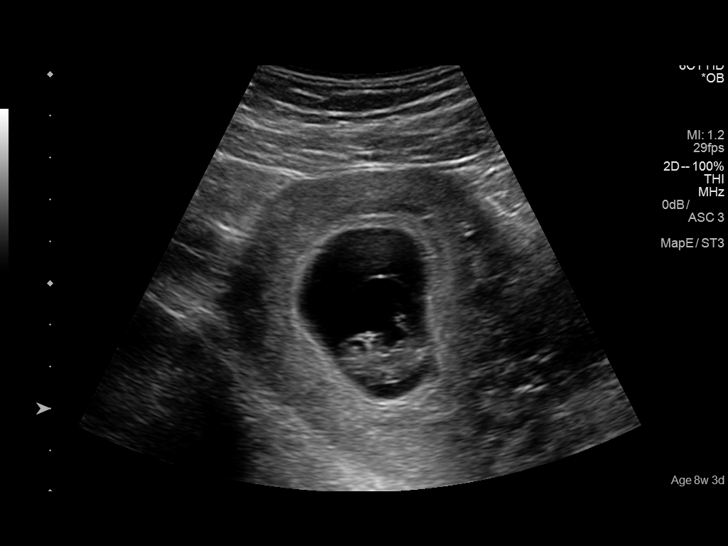
[im 10/25]
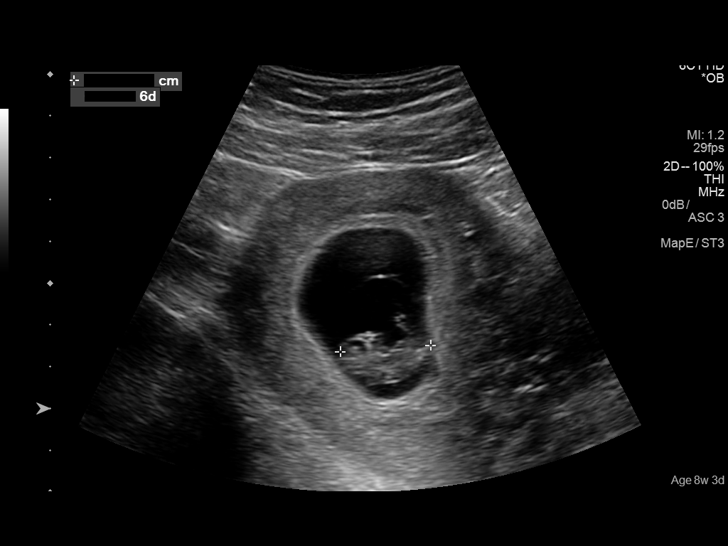
[im 12/25]
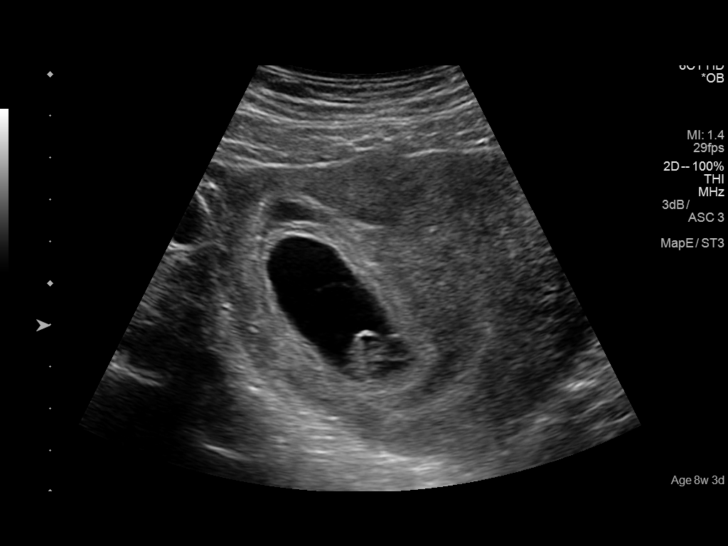
[im 14/25]
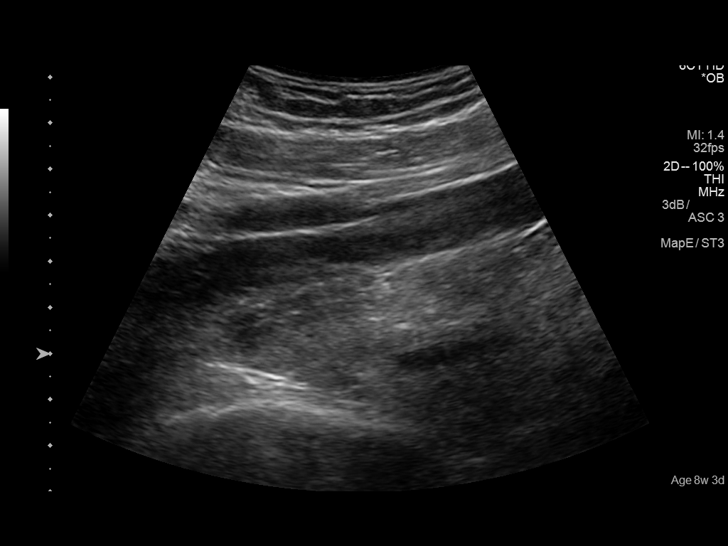
[im 16/25]
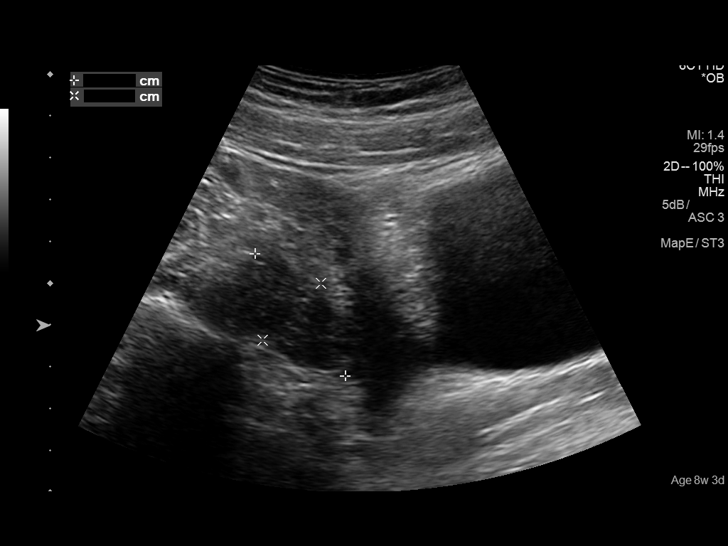
[im 17/25]
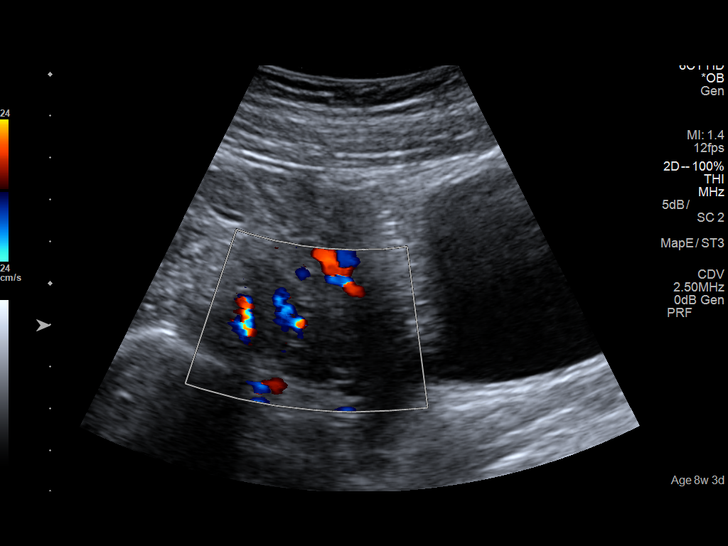
[im 19/25]
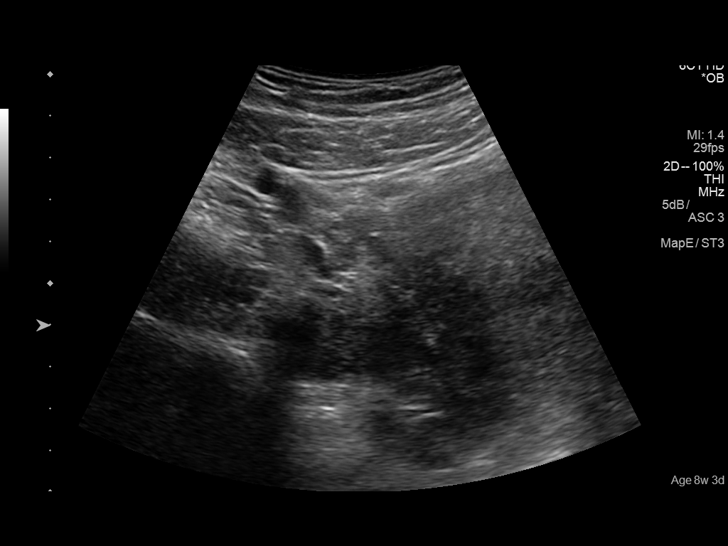
[im 21/25]
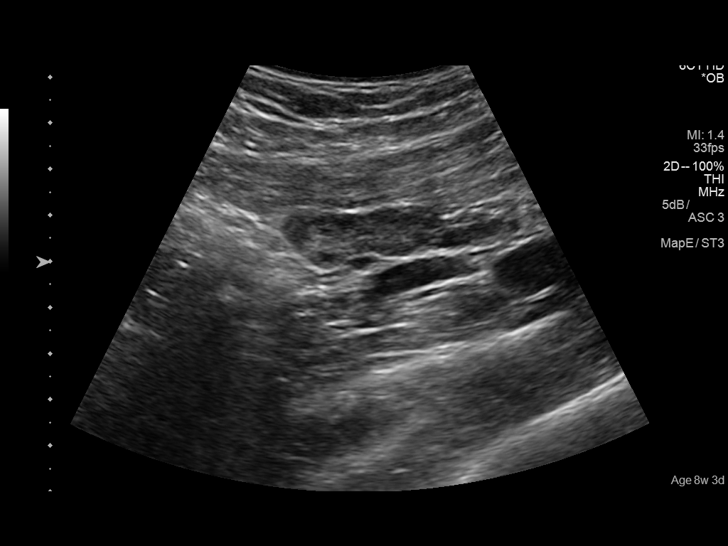
[im 23/25]
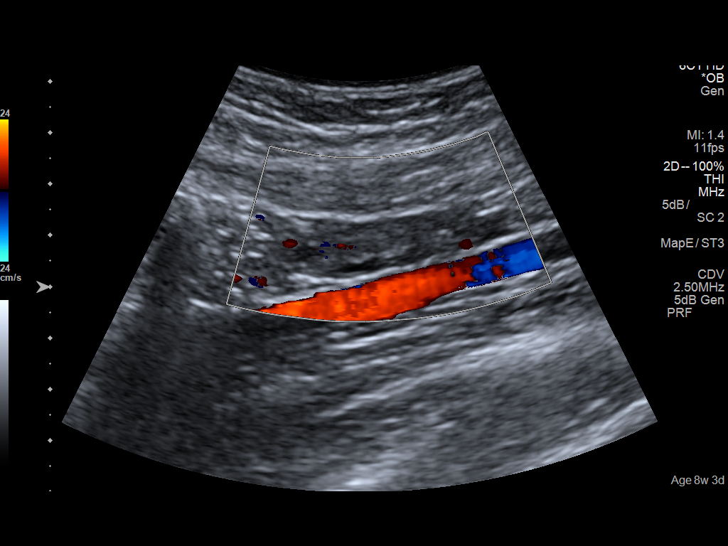
[im 25/25]
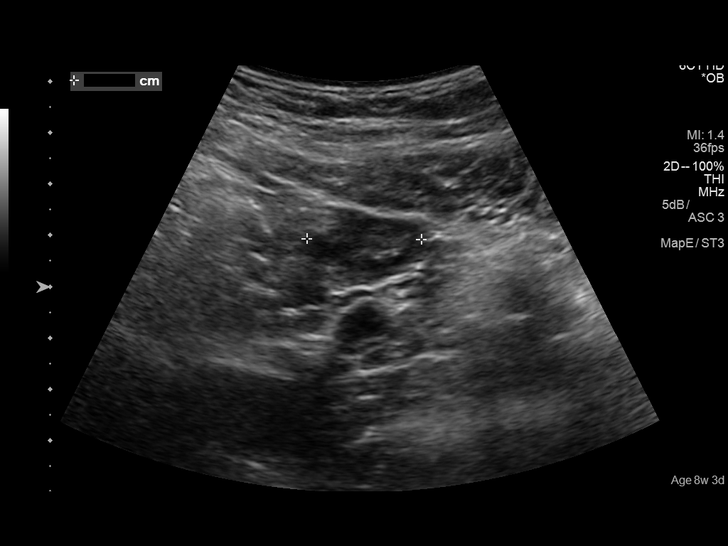

[14 of 25 positions shown; findings below may reference images not displayed]

FINDINGS: Intrauterine gestational sac: Single

Yolk sac:  Present

Embryo:  Present

Cardiac Activity: Present

Heart Rate: 175  bpm

MSD:   mm    w     d

CRL:  22  mm   8 w   6 d                  US EDC: 03/05/2017

Subchorionic hemorrhage: Small subchorionic hemorrhage without
significant mass effect.

Maternal uterus/adnexae: Unremarkable.
IMPRESSION: 1. Single viable IUP as above.
2. Small subchorionic hemorrhage without mass effect or other
complication.

## 2020-07-15 ENCOUNTER — Other Ambulatory Visit: Payer: Medicaid Other

## 2020-07-15 DIAGNOSIS — Z20822 Contact with and (suspected) exposure to covid-19: Secondary | ICD-10-CM

## 2020-07-17 LAB — SARS-COV-2, NAA 2 DAY TAT

## 2020-07-17 LAB — NOVEL CORONAVIRUS, NAA: SARS-CoV-2, NAA: NOT DETECTED

## 2021-11-15 NOTE — Congregational Nurse Program (Signed)
Patient presented to [facility]site [employer] for return visit and received 20.4 lbs of food for family of  3 ?

## 2021-12-31 DIAGNOSIS — Z419 Encounter for procedure for purposes other than remedying health state, unspecified: Secondary | ICD-10-CM | POA: Diagnosis not present

## 2022-01-05 DIAGNOSIS — R5381 Other malaise: Secondary | ICD-10-CM | POA: Diagnosis not present

## 2022-01-05 DIAGNOSIS — Z1331 Encounter for screening for depression: Secondary | ICD-10-CM | POA: Diagnosis not present

## 2022-01-05 DIAGNOSIS — R87613 High grade squamous intraepithelial lesion on cytologic smear of cervix (HGSIL): Secondary | ICD-10-CM | POA: Diagnosis not present

## 2022-01-05 DIAGNOSIS — Z01419 Encounter for gynecological examination (general) (routine) without abnormal findings: Secondary | ICD-10-CM | POA: Diagnosis not present

## 2022-01-05 DIAGNOSIS — Z30016 Encounter for initial prescription of transdermal patch hormonal contraceptive device: Secondary | ICD-10-CM | POA: Diagnosis not present

## 2022-01-10 ENCOUNTER — Other Ambulatory Visit (HOSPITAL_COMMUNITY)
Admission: RE | Admit: 2022-01-10 | Discharge: 2022-01-10 | Disposition: A | Discharge status: Home / Self Care | Payer: Medicaid Other | Source: Ambulatory Visit | Attending: Nurse Practitioner | Admitting: Nurse Practitioner

## 2022-01-10 DIAGNOSIS — R87613 High grade squamous intraepithelial lesion on cytologic smear of cervix (HGSIL): Secondary | ICD-10-CM | POA: Diagnosis not present

## 2022-01-10 DIAGNOSIS — D069 Carcinoma in situ of cervix, unspecified: Secondary | ICD-10-CM | POA: Diagnosis not present

## 2022-01-13 LAB — SURGICAL PATHOLOGY

## 2022-01-16 ENCOUNTER — Ambulatory Visit
Admission: EM | Admit: 2022-01-16 | Discharge: 2022-01-16 | Disposition: A | Discharge status: Home / Self Care | Payer: Medicaid Other | Attending: Nurse Practitioner | Admitting: Nurse Practitioner

## 2022-01-16 DIAGNOSIS — J069 Acute upper respiratory infection, unspecified: Secondary | ICD-10-CM | POA: Diagnosis not present

## 2022-01-16 DIAGNOSIS — J029 Acute pharyngitis, unspecified: Secondary | ICD-10-CM | POA: Insufficient documentation

## 2022-01-16 LAB — POCT RAPID STREP A (OFFICE): Rapid Strep A Screen: NEGATIVE

## 2022-01-16 MED ORDER — PROMETHAZINE-DM 6.25-15 MG/5ML PO SYRP: 5.0000 mL | ORAL_SOLUTION | Freq: Every evening | ORAL | 0 refills | Status: AC | PRN

## 2022-01-16 MED ORDER — BENZONATATE 100 MG PO CAPS: 100.0000 mg | ORAL_CAPSULE | Freq: Three times a day (TID) | ORAL | 0 refills | Status: AC | PRN

## 2022-01-16 NOTE — ED Triage Notes (Signed)
Pt reports sore throat, runny nose and rash in right leg x 1 day.  Pt has not taken any meds for complaint.   Pt requested COVID test.

## 2022-01-16 NOTE — Discharge Instructions (Addendum)
Your symptoms and exam findings are most consistent with a viral upper respiratory infection. These usually run their course in about 10 days.  If your symptoms last longer than 10 days without improvement, please follow up with your primary care provider.  If your symptoms, worsen, please go to the Emergency Room.    We have tested you today for strep throat (rapid test was negative), COVID-19, and influenza.  You will see the results in Mychart and we will call you with positive results.    Please stay home and isolate until you are aware of the results.    Some things that can make you feel better are: - Increased rest - Increasing fluid with water/sugar free electrolytes - Acetaminophen and ibuprofen as needed for fever/pain.  - Salt water gargling, chloraseptic spray and throat lozenges - OTC guaifenesin (Mucinex).  - Saline sinus flushes or a neti pot.  - Humidifying the air. - Cough syrup as needed at night time for cough; cough perles every 8 hours as needed.  Do not take these while driving or operating heavy machinery.

## 2022-01-16 NOTE — ED Provider Notes (Signed)
[facility]    CSN: [id number] Arrival date & time: 01/16/22  1056      History   Chief Complaint Chief Complaint  Patient presents with   Nasal Congestion         HPI Patty Atkins is a 31 y.o. female.   Patient presents with daughter, son, and niece who are having similar symptoms.  Patient reports cough, congestion, sore throat that started yesterday.  She endorses subjective fever at home, chest pain with coughing, some shortness of breath with cough.  She denies abdominal pain, nausea/vomiting, fevers.  She endorses runny nose, postnasal drainage, bilateral ear pressure.  She has a rash on her right thigh that she thinks is improving slowly.  She endorses decreased appetite and increased fatigue.  Has not taken anything for the symptoms.  She is requesting COVID-19 testing today.  She is also wondering about a mold test.      Past Medical History:  Diagnosis Date   Anemia     Patient Active Problem List   Diagnosis Date Noted   Pregnant 08/24/2017   Supervision of other normal pregnancy, antepartum 03/06/2017    Past Surgical History:  Procedure Laterality Date   DILATION AND CURETTAGE OF UTERUS     HERNIA REPAIR      OB History     Gravida  3   Para  2   Term  2   Preterm      AB  1   Living  2      SAB      IAB  1   Ectopic      Multiple  0   Live Births  2            Home Medications    Prior to Admission medications   Medication Sig Start Date End Date Taking? Authorizing Provider  benzonatate (TESSALON) 100 MG capsule Take 1 capsule (100 mg total) by mouth 3 (three) times daily as needed for cough. Do not take with alcohol or while driving or operating heavy machinery 01/16/22  Yes Valentino Nose, NP  promethazine-dextromethorphan (PROMETHAZINE-DM) 6.25-15 MG/5ML syrup Take 5 mLs by mouth at bedtime as needed for cough. Do not take with alcohol or while driving or operating heavy machinery 01/16/22  Yes  Cathlean Marseilles A, NP  ferrous sulfate (FEROSUL) 220 (44 Fe) MG/5ML solution Take 5 mLs (220 mg total) by mouth 2 (two) times daily with a meal. 07/16/17   Judeth Horn, NP  Multiple Vitamin (MULTIVITAMIN) capsule Take 1 capsule by mouth daily.    [provider]  Prenatal Vit-Fe Fumarate-FA (PRENATAL MULTIVITAMIN) TABS tablet Take 1 tablet by mouth daily at 12 noon.    [provider]    Family History Family History  Problem Relation Age of Onset   Diabetes Maternal Grandmother     Social History Social History   Tobacco Use   Smoking status: Former   Smokeless tobacco: Never  Substance Use Topics   Alcohol use: No   Drug use: No     Allergies   Patient has no known allergies.   Review of Systems Review of Systems Per HPI  Physical Exam Triage Vital Signs ED Triage Vitals  Enc Vitals Group     BP 01/16/22 1125 121/75     Pulse Rate 01/16/22 1125 (!) 107     Resp 01/16/22 1125 18     Temp 01/16/22 1125 98.9 F (37.2 C)     Temp  Source 01/16/22 1125 Oral     SpO2 01/16/22 1125 96 %     Weight --      Height --      Head Circumference --      Peak Flow --      Pain Score 01/16/22 1123 7     Pain Loc --      Pain Edu? --      Excl. in GC? --    No data found.  Updated Vital Signs BP 121/75 (BP Location: Right Arm)   Pulse (!) 107   Temp 98.9 F (37.2 C) (Oral)   Resp 18   LMP 12/30/2021 (Exact Date)   SpO2 96%   Visual Acuity Right Eye Distance:   Left Eye Distance:   Bilateral Distance:    Right Eye Near:   Left Eye Near:    Bilateral Near:     Physical Exam Vitals and nursing note reviewed.  Constitutional:      General: She is not in acute distress.    Appearance: Normal appearance. She is not ill-appearing or toxic-appearing.  HENT:     Head: Normocephalic and atraumatic.     Right Ear: Tympanic membrane, ear canal and external ear normal.     Left Ear: Tympanic membrane, ear canal and external ear normal.      Nose: Congestion and rhinorrhea present.     Mouth/Throat:     Mouth: Mucous membranes are moist.     Pharynx: Oropharynx is clear. Uvula midline. Posterior oropharyngeal erythema present. No oropharyngeal exudate or uvula swelling.     Tonsils: No tonsillar exudate or tonsillar abscesses. 1+ on the right. 1+ on the left.  Eyes:     General: No scleral icterus.    Extraocular Movements: Extraocular movements intact.  Cardiovascular:     Rate and Rhythm: Normal rate and regular rhythm.  Pulmonary:     Effort: Pulmonary effort is normal. No respiratory distress.     Breath sounds: Normal breath sounds. No wheezing, rhonchi or rales.  Abdominal:     General: Abdomen is flat. Bowel sounds are normal. There is no distension.     Palpations: Abdomen is soft.     Tenderness: There is no abdominal tenderness. There is no guarding.  Musculoskeletal:     Cervical back: Normal range of motion and neck supple.  Lymphadenopathy:     Cervical: Cervical adenopathy present.  Skin:    General: Skin is warm and dry.     Capillary Refill: Capillary refill takes less than 2 seconds.     Coloration: Skin is not jaundiced or pale.     Findings: No erythema or rash.  Neurological:     Mental Status: She is alert and oriented to person, place, and time.  Psychiatric:        Behavior: Behavior is cooperative.      UC Treatments / Results  Labs (all labs ordered are listed, but only abnormal results are displayed) Labs Reviewed  CULTURE, GROUP A STREP ([facility])  COVID-19, FLU A+B NAA  POCT RAPID STREP A (OFFICE)    EKG   Radiology No results found.  Procedures Procedures (including critical care time)  Medications Ordered in UC Medications - No data to display  Initial Impression / Assessment and Plan / UC Course  I have reviewed the triage vital signs and the nursing notes.  Pertinent labs & imaging results that were available during my care of the patient were reviewed by me and  considered in my medical decision making (see chart for details).    Patient is a very pleasant, well-appearing 31 year old female presenting with viral upper respiratory symptoms.  Rapid strep throat test today is negative, will send for throat culture given tonsillar hypertrophy on exam.  COVID-19, influenza testing obtained.  Reassurance provided.  Supportive care discussed.  Prescriptions given for cough suppressants-precautions given not to take while driving or operating heavy machinery.  Seek care if symptoms persist or worsen more than 10 days without improvement.  The patient was given the opportunity to ask questions.  All questions answered to their satisfaction.  The patient is in agreement to this plan.   Final Clinical Impressions(s) / UC Diagnoses   Final diagnoses:  Viral URI with cough  Acute pharyngitis, unspecified etiology     Discharge Instructions      Your symptoms and exam findings are most consistent with a viral upper respiratory infection. These usually run their course in about 10 days.  If your symptoms last longer than 10 days without improvement, please follow up with your primary care provider.  If your symptoms, worsen, please go to the Emergency Room.    We have tested you today for strep throat (rapid test was negative), COVID-19, and influenza.  You will see the results in Mychart and we will call you with positive results.    Please stay home and isolate until you are aware of the results.    Some things that can make you feel better are: - Increased rest - Increasing fluid with water/sugar free electrolytes - Acetaminophen and ibuprofen as needed for fever/pain.  - Salt water gargling, chloraseptic spray and throat lozenges - OTC guaifenesin (Mucinex).  - Saline sinus flushes or a neti pot.  - Humidifying the air. - Cough syrup as needed at night time for cough; cough perles every 8 hours as needed.  Do not take these while driving or operating heavy  machinery.    ED Prescriptions     Medication Sig Dispense Auth. Provider   promethazine-dextromethorphan (PROMETHAZINE-DM) 6.25-15 MG/5ML syrup Take 5 mLs by mouth at bedtime as needed for cough. Do not take with alcohol or while driving or operating heavy machinery 118 mL Cathlean Marseilles A, NP   benzonatate (TESSALON) 100 MG capsule Take 1 capsule (100 mg total) by mouth 3 (three) times daily as needed for cough. Do not take with alcohol or while driving or operating heavy machinery 21 capsule Valentino Nose, NP      PDMP not reviewed this encounter.   Valentino Nose, NP 01/16/22 1247

## 2022-01-17 ENCOUNTER — Ambulatory Visit: Payer: Self-pay | Admitting: Nurse Practitioner

## 2022-01-18 LAB — COVID-19, FLU A+B NAA
Influenza A, NAA: NOT DETECTED
Influenza B, NAA: NOT DETECTED
SARS-CoV-2, NAA: NOT DETECTED

## 2022-01-19 LAB — CULTURE, GROUP A STREP (THRC)

## 2022-01-31 ENCOUNTER — Ambulatory Visit: Payer: Self-pay | Admitting: Nurse Practitioner

## 2022-01-31 DIAGNOSIS — Z419 Encounter for procedure for purposes other than remedying health state, unspecified: Secondary | ICD-10-CM | POA: Diagnosis not present

## 2022-03-03 DIAGNOSIS — Z419 Encounter for procedure for purposes other than remedying health state, unspecified: Secondary | ICD-10-CM | POA: Diagnosis not present

## 2022-03-16 ENCOUNTER — Encounter: Payer: Self-pay | Admitting: Obstetrics and Gynecology

## 2022-03-16 ENCOUNTER — Other Ambulatory Visit (HOSPITAL_COMMUNITY)
Admission: RE | Admit: 2022-03-16 | Discharge: 2022-03-16 | Disposition: A | Discharge status: Home / Self Care | Payer: Medicaid Other | Source: Ambulatory Visit | Attending: Obstetrics & Gynecology | Admitting: Obstetrics & Gynecology

## 2022-03-16 ENCOUNTER — Ambulatory Visit (INDEPENDENT_AMBULATORY_CARE_PROVIDER_SITE_OTHER): Payer: Medicaid Other | Admitting: Obstetrics and Gynecology

## 2022-03-16 VITALS — BP 140/84 | HR 106 | Wt 254.0 lb

## 2022-03-16 DIAGNOSIS — N871 Moderate cervical dysplasia: Secondary | ICD-10-CM | POA: Insufficient documentation

## 2022-03-16 DIAGNOSIS — Z01812 Encounter for preprocedural laboratory examination: Secondary | ICD-10-CM

## 2022-03-16 DIAGNOSIS — D069 Carcinoma in situ of cervix, unspecified: Secondary | ICD-10-CM | POA: Diagnosis not present

## 2022-03-16 LAB — POCT URINE PREGNANCY: Preg Test, Ur: NEGATIVE

## 2022-03-16 NOTE — Progress Notes (Unsigned)
Patient here for LEEP procedure  Patient identified, informed consent obtained, signed copy in chart, time out performed.  Pap smear and colposcopy reviewed.   Pap HGSIL 10/2021 Colpo Biopsy CIN 2 12/2021 ECC CIN 1 12/2021 Teflon coated speculum with smoke evacuator placed.  Cervix visualized. Paracervical block placed.  medium size LOOP used to remove cone of cervix using blend of cut and cautery on LEEP machine.  Edges/Base cauterized with Ball.  Monsel's solution used for hemostasis.  Patient tolerated procedure well.  Patient given post procedure instructions.  Follow up in 4 months for repeat pap or as needed. Discussed Gardasil vaccine as patient does not believe she received it

## 2022-03-17 LAB — SURGICAL PATHOLOGY

## 2022-04-02 DIAGNOSIS — Z419 Encounter for procedure for purposes other than remedying health state, unspecified: Secondary | ICD-10-CM | POA: Diagnosis not present

## 2022-04-25 ENCOUNTER — Ambulatory Visit: Payer: Medicaid Other | Admitting: Family Medicine

## 2022-05-03 DIAGNOSIS — Z419 Encounter for procedure for purposes other than remedying health state, unspecified: Secondary | ICD-10-CM | POA: Diagnosis not present

## 2022-06-02 DIAGNOSIS — Z419 Encounter for procedure for purposes other than remedying health state, unspecified: Secondary | ICD-10-CM | POA: Diagnosis not present

## 2022-07-03 DIAGNOSIS — Z419 Encounter for procedure for purposes other than remedying health state, unspecified: Secondary | ICD-10-CM | POA: Diagnosis not present

## 2022-08-03 DIAGNOSIS — Z419 Encounter for procedure for purposes other than remedying health state, unspecified: Secondary | ICD-10-CM | POA: Diagnosis not present

## 2022-09-01 DIAGNOSIS — Z419 Encounter for procedure for purposes other than remedying health state, unspecified: Secondary | ICD-10-CM | POA: Diagnosis not present

## 2022-09-14 DIAGNOSIS — Z3482 Encounter for supervision of other normal pregnancy, second trimester: Secondary | ICD-10-CM | POA: Diagnosis not present

## 2022-09-14 DIAGNOSIS — Z3483 Encounter for supervision of other normal pregnancy, third trimester: Secondary | ICD-10-CM | POA: Diagnosis not present

## 2022-10-02 DIAGNOSIS — Z419 Encounter for procedure for purposes other than remedying health state, unspecified: Secondary | ICD-10-CM | POA: Diagnosis not present

## 2022-10-13 ENCOUNTER — Ambulatory Visit (INDEPENDENT_AMBULATORY_CARE_PROVIDER_SITE_OTHER): Payer: Medicaid Other | Admitting: General Practice

## 2022-10-13 ENCOUNTER — Other Ambulatory Visit (HOSPITAL_COMMUNITY)
Admission: RE | Admit: 2022-10-13 | Discharge: 2022-10-13 | Disposition: A | Discharge status: Home / Self Care | Payer: Medicaid Other | Source: Ambulatory Visit | Attending: Obstetrics and Gynecology | Admitting: Obstetrics and Gynecology

## 2022-10-13 VITALS — BP 126/87 | HR 91 | Ht 68.0 in | Wt 249.5 lb

## 2022-10-13 DIAGNOSIS — N898 Other specified noninflammatory disorders of vagina: Secondary | ICD-10-CM

## 2022-10-13 NOTE — Progress Notes (Signed)
SUBJECTIVE:  32 y.o. female complains of malodorous vaginal discharge for 1 week(s). Denies abnormal vaginal bleeding or significant pelvic pain or fever. No UTI symptoms. Denies history of known exposure to STD.  No LMP recorded.  OBJECTIVE:  She appears well, afebrile. Urine dipstick: not done.  ASSESSMENT:  Vaginal Discharge  Vaginal Odor   PLAN:  GC, chlamydia, trichomonas, BVAG, CVAG probe sent to lab. Treatment: To be determined once lab results are received ROV prn if symptoms persist or worsen.

## 2022-10-14 LAB — HEPATITIS B SURFACE ANTIGEN: Hepatitis B Surface Ag: NEGATIVE

## 2022-10-14 LAB — HIV ANTIBODY (ROUTINE TESTING W REFLEX): HIV Screen 4th Generation wRfx: NONREACTIVE

## 2022-10-14 LAB — HEPATITIS C ANTIBODY: Hep C Virus Ab: NONREACTIVE

## 2022-10-14 LAB — RPR: RPR Ser Ql: NONREACTIVE

## 2022-10-17 LAB — CERVICOVAGINAL ANCILLARY ONLY
Bacterial Vaginitis (gardnerella): POSITIVE — AB
Candida Glabrata: NEGATIVE
Candida Vaginitis: NEGATIVE
Chlamydia: NEGATIVE
Comment: NEGATIVE
Comment: NEGATIVE
Comment: NEGATIVE
Comment: NEGATIVE
Comment: NEGATIVE
Comment: NORMAL
Neisseria Gonorrhea: NEGATIVE
Trichomonas: NEGATIVE

## 2022-10-23 ENCOUNTER — Other Ambulatory Visit: Payer: Self-pay

## 2022-10-23 ENCOUNTER — Ambulatory Visit: Payer: Medicaid Other | Admitting: Obstetrics and Gynecology

## 2022-10-23 DIAGNOSIS — N760 Acute vaginitis: Secondary | ICD-10-CM

## 2022-10-23 MED ORDER — METRONIDAZOLE 500 MG PO TABS: 500.0000 mg | ORAL_TABLET | Freq: Two times a day (BID) | ORAL | 0 refills | Status: AC

## 2022-10-24 DIAGNOSIS — J039 Acute tonsillitis, unspecified: Secondary | ICD-10-CM | POA: Diagnosis not present

## 2022-11-01 DIAGNOSIS — Z419 Encounter for procedure for purposes other than remedying health state, unspecified: Secondary | ICD-10-CM | POA: Diagnosis not present

## 2022-11-15 ENCOUNTER — Ambulatory Visit: Payer: Medicaid Other | Admitting: Family Medicine

## 2022-12-02 DIAGNOSIS — Z419 Encounter for procedure for purposes other than remedying health state, unspecified: Secondary | ICD-10-CM | POA: Diagnosis not present

## 2023-01-01 DIAGNOSIS — Z419 Encounter for procedure for purposes other than remedying health state, unspecified: Secondary | ICD-10-CM | POA: Diagnosis not present

## 2023-02-01 DIAGNOSIS — Z419 Encounter for procedure for purposes other than remedying health state, unspecified: Secondary | ICD-10-CM | POA: Diagnosis not present

## 2023-03-04 DIAGNOSIS — Z419 Encounter for procedure for purposes other than remedying health state, unspecified: Secondary | ICD-10-CM | POA: Diagnosis not present

## 2023-04-03 DIAGNOSIS — Z419 Encounter for procedure for purposes other than remedying health state, unspecified: Secondary | ICD-10-CM | POA: Diagnosis not present

## 2023-05-04 DIAGNOSIS — Z419 Encounter for procedure for purposes other than remedying health state, unspecified: Secondary | ICD-10-CM | POA: Diagnosis not present

## 2023-06-03 DIAGNOSIS — Z419 Encounter for procedure for purposes other than remedying health state, unspecified: Secondary | ICD-10-CM | POA: Diagnosis not present

## 2023-07-04 DIAGNOSIS — Z419 Encounter for procedure for purposes other than remedying health state, unspecified: Secondary | ICD-10-CM | POA: Diagnosis not present

## 2023-07-15 DIAGNOSIS — Z419 Encounter for procedure for purposes other than remedying health state, unspecified: Secondary | ICD-10-CM | POA: Diagnosis not present

## 2023-08-04 DIAGNOSIS — Z419 Encounter for procedure for purposes other than remedying health state, unspecified: Secondary | ICD-10-CM | POA: Diagnosis not present

## 2023-08-15 DIAGNOSIS — Z419 Encounter for procedure for purposes other than remedying health state, unspecified: Secondary | ICD-10-CM | POA: Diagnosis not present

## 2023-09-01 DIAGNOSIS — Z419 Encounter for procedure for purposes other than remedying health state, unspecified: Secondary | ICD-10-CM | POA: Diagnosis not present

## 2023-10-13 DIAGNOSIS — Z419 Encounter for procedure for purposes other than remedying health state, unspecified: Secondary | ICD-10-CM | POA: Diagnosis not present

## 2023-11-06 ENCOUNTER — Encounter: Admitting: Family Medicine

## 2023-11-12 DIAGNOSIS — Z419 Encounter for procedure for purposes other than remedying health state, unspecified: Secondary | ICD-10-CM | POA: Diagnosis not present

## 2023-12-04 ENCOUNTER — Ambulatory Visit

## 2023-12-11 ENCOUNTER — Encounter: Payer: Self-pay | Admitting: Obstetrics and Gynecology

## 2023-12-13 ENCOUNTER — Ambulatory Visit: Admitting: Obstetrics

## 2023-12-13 ENCOUNTER — Encounter: Payer: Self-pay | Admitting: Obstetrics

## 2023-12-13 VITALS — BP 135/85 | HR 101 | Ht 68.0 in | Wt 244.0 lb

## 2023-12-13 DIAGNOSIS — D500 Iron deficiency anemia secondary to blood loss (chronic): Secondary | ICD-10-CM | POA: Diagnosis not present

## 2023-12-13 DIAGNOSIS — E669 Obesity, unspecified: Secondary | ICD-10-CM | POA: Diagnosis not present

## 2023-12-13 DIAGNOSIS — Z419 Encounter for procedure for purposes other than remedying health state, unspecified: Secondary | ICD-10-CM | POA: Diagnosis not present

## 2023-12-13 MED ORDER — VITAFOL ULTRA 29-0.6-0.4-200 MG PO CAPS: 1.0000 | ORAL_CAPSULE | Freq: Every day | ORAL | 4 refills | Status: AC

## 2023-12-13 MED ORDER — ACCRUFER 30 MG PO CAPS: 1.0000 | ORAL_CAPSULE | Freq: Two times a day (BID) | ORAL | 3 refills | Status: DC

## 2023-12-13 NOTE — Progress Notes (Signed)
 Patient ID: Patty Atkins, female   DOB: [birth year], 33 y.o.   MRN: [id number]  Chief Complaint  Patient presents with   Medication Problem    HPI Patty Atkins is a 33 y.o. female.  Chronic fatigue. HPI  Past Medical History:  Diagnosis Date   Anemia    Vaginal Pap smear, abnormal     Past Surgical History:  Procedure Laterality Date   DILATION AND CURETTAGE OF UTERUS     HERNIA REPAIR      Family History  Problem Relation Age of Onset   Diabetes Maternal Grandmother     Social History Social History   Tobacco Use   Smoking status: Former   Smokeless tobacco: Never  Substance Use Topics   Alcohol use: No   Drug use: No    No Known Allergies  Current Outpatient Medications  Medication Sig Dispense Refill   Ferric Maltol (ACCRUFER) 30 MG CAPS Take 1 capsule (30 mg total) by mouth 2 (two) times daily before a meal. Take 2 hrs before, or 2 hrs after a meal. 60 capsule 3   Prenat-Fe Poly-Methfol-FA-DHA (VITAFOL ULTRA) 29-0.6-0.4-200 MG CAPS Take 1 capsule by mouth daily before breakfast. 90 capsule 4   benzonatate  (TESSALON ) 100 MG capsule Take 1 capsule (100 mg total) by mouth 3 (three) times daily as needed for cough. Do not take with alcohol or while driving or operating heavy machinery (Patient not taking: Reported on 10/13/2022) 21 capsule 0   ferrous sulfate  (FEROSUL) 220 (44 Fe) MG/5ML solution Take 5 mLs (220 mg total) by mouth 2 (two) times daily with a meal. (Patient not taking: Reported on 10/13/2022) 473 mL 0   metroNIDAZOLE  (FLAGYL ) 500 MG tablet Take 1 tablet (500 mg total) by mouth 2 (two) times daily. 14 tablet 0   promethazine -dextromethorphan (PROMETHAZINE -DM) 6.25-15 MG/5ML syrup Take 5 mLs by mouth at bedtime as needed for cough. Do not take with alcohol or while driving or operating heavy machinery (Patient not taking: Reported on 10/13/2022) 118 mL 0   No current facility-administered medications for this visit.    Review of Systems Review of  Systems Constitutional:  positive for fatigue and weight gain Respiratory: negative for cough and wheezing Cardiovascular: negative for chest pain, fatigue and palpitations Gastrointestinal: negative for abdominal pain and change in bowel habits Genitourinary:negative Integument/breast: negative for nipple discharge Musculoskeletal:negative for myalgias Neurological: negative for gait problems and tremors Behavioral/Psych: negative for abusive relationship, depression Endocrine: negative for temperature intolerance      Blood pressure 135/85, pulse (!) 101, height 5' 8 (1.727 m), weight 244 lb (110.7 kg), unknown if currently breastfeeding.  Physical Exam Physical Exam General:   Alert and no distress  Skin:   no rash or abnormalities  Lungs:   clear to auscultation bilaterally  Heart:   regular rate and rhythm, S1, S2 normal, no murmur, click, rub or gallop  The remainder of the physical exam deferred due to the type of encounter.   I have spent a total of 15 minutes of face-to-face time, excluding clinical staff time, reviewing notes and preparing to see patient, ordering tests and/or medications, and counseling the patient.   Data Reviewed CBC  Assessment     1. Iron deficiency anemia due to chronic blood loss (Primary) Rx: - Ferritin - CBC - Comp Met (CMET) - TSH - Hemoglobin A1c - Ferric Maltol (ACCRUFER) 30 MG CAPS; Take 1 capsule (30 mg total) by mouth 2 (two) times daily before a meal. Take 2 hrs  before, or 2 hrs after a meal.  Dispense: 60 capsule; Refill: 3 - Prenat-Fe Poly-Methfol-FA-DHA (VITAFOL ULTRA) 29-0.6-0.4-200 MG CAPS; Take 1 capsule by mouth daily before breakfast.  Dispense: 90 capsule; Refill: 4  2. Obesity (BMI 35.0-39.9 without comorbidity) - weight reduction with the aid of dietary changes, exercise and behavioral modification recommended     Plan   Follow up in 3 months for Annual  Orders Placed This Encounter  Procedures   Ferritin   CBC    Comp Met (CMET)   TSH   Hemoglobin A1c   Meds ordered this encounter  Medications   Ferric Maltol (ACCRUFER) 30 MG CAPS    Sig: Take 1 capsule (30 mg total) by mouth 2 (two) times daily before a meal. Take 2 hrs before, or 2 hrs after a meal.    Dispense:  60 capsule    Refill:  3   Prenat-Fe Poly-Methfol-FA-DHA (VITAFOL ULTRA) 29-0.6-0.4-200 MG CAPS    Sig: Take 1 capsule by mouth daily before breakfast.    Dispense:  90 capsule    Refill:  4       Gabrielle Joiner, MD, FACOG Attending Obstetrician & Gynecologist, [facility] for [facility], [facility], [place] 12/13/2023

## 2023-12-13 NOTE — Progress Notes (Signed)
 Taking OTC Vits. Hx anemia. Last CBC done here shows anemia. Feeling tired. Thinks lack of FE. No other problems. Plans to see new PCP next month.

## 2023-12-14 ENCOUNTER — Ambulatory Visit: Payer: Self-pay | Admitting: Obstetrics

## 2023-12-14 LAB — HEMOGLOBIN A1C
Est. average glucose Bld gHb Est-mCnc: 117 mg/dL
Hgb A1c MFr Bld: 5.7 % — ABNORMAL HIGH (ref 4.8–5.6)

## 2023-12-14 LAB — CBC
Hematocrit: 34.9 % (ref 34.0–46.6)
Hemoglobin: 10.4 g/dL — ABNORMAL LOW (ref 11.1–15.9)
MCH: 23.2 pg — ABNORMAL LOW (ref 26.6–33.0)
MCHC: 29.8 g/dL — ABNORMAL LOW (ref 31.5–35.7)
MCV: 78 fL — ABNORMAL LOW (ref 79–97)
Platelets: 312 10*3/uL (ref 150–450)
RBC: 4.48 x10E6/uL (ref 3.77–5.28)
RDW: 15.9 % — ABNORMAL HIGH (ref 11.7–15.4)
WBC: 5.4 10*3/uL (ref 3.4–10.8)

## 2023-12-14 LAB — COMPREHENSIVE METABOLIC PANEL WITH GFR
ALT: 14 IU/L (ref 0–32)
AST: 16 IU/L (ref 0–40)
Albumin: 3.9 g/dL (ref 3.9–4.9)
Alkaline Phosphatase: 61 IU/L (ref 44–121)
BUN/Creatinine Ratio: 11 (ref 9–23)
BUN: 10 mg/dL (ref 6–20)
Bilirubin Total: <0.2 mg/dL (ref 0.0–1.2)
CO2: 20 mmol/L (ref 20–29)
Calcium: 8.8 mg/dL (ref 8.7–10.2)
Chloride: 104 mmol/L (ref 96–106)
Creatinine, Ser: 0.89 mg/dL (ref 0.57–1.00)
Globulin, Total: 3 g/dL (ref 1.5–4.5)
Glucose: 114 mg/dL — ABNORMAL HIGH (ref 70–99)
Potassium: 3.9 mmol/L (ref 3.5–5.2)
Sodium: 138 mmol/L (ref 134–144)
Total Protein: 6.9 g/dL (ref 6.0–8.5)
eGFR: 88 mL/min/{1.73_m2} (ref >59)

## 2023-12-14 LAB — FERRITIN: Ferritin: 29 ng/mL (ref 15–150)

## 2023-12-14 LAB — TSH: TSH: 0.739 u[IU]/mL (ref 0.450–4.500)

## 2023-12-29 DIAGNOSIS — S60141A Contusion of right ring finger with damage to nail, initial encounter: Secondary | ICD-10-CM | POA: Diagnosis not present

## 2024-01-12 DIAGNOSIS — Z419 Encounter for procedure for purposes other than remedying health state, unspecified: Secondary | ICD-10-CM | POA: Diagnosis not present

## 2024-01-15 ENCOUNTER — Ambulatory Visit: Admitting: Family Medicine

## 2024-01-16 ENCOUNTER — Ambulatory Visit

## 2024-03-14 DIAGNOSIS — Z419 Encounter for procedure for purposes other than remedying health state, unspecified: Secondary | ICD-10-CM | POA: Diagnosis not present

## 2024-03-18 ENCOUNTER — Ambulatory Visit: Admitting: Obstetrics

## 2024-04-13 DIAGNOSIS — Z419 Encounter for procedure for purposes other than remedying health state, unspecified: Secondary | ICD-10-CM | POA: Diagnosis not present

## 2024-04-23 ENCOUNTER — Other Ambulatory Visit: Payer: Self-pay

## 2024-04-23 DIAGNOSIS — D500 Iron deficiency anemia secondary to blood loss (chronic): Secondary | ICD-10-CM

## 2024-04-23 MED ORDER — ACCRUFER 30 MG PO CAPS: 1.0000 | ORAL_CAPSULE | Freq: Two times a day (BID) | ORAL | 3 refills | Status: AC

## 2024-05-07 ENCOUNTER — Ambulatory Visit: Admitting: Obstetrics

## 2024-05-15 ENCOUNTER — Ambulatory Visit (INDEPENDENT_AMBULATORY_CARE_PROVIDER_SITE_OTHER): Admitting: Obstetrics

## 2024-05-15 ENCOUNTER — Encounter: Payer: Self-pay | Admitting: Obstetrics

## 2024-05-15 ENCOUNTER — Other Ambulatory Visit (HOSPITAL_COMMUNITY)
Admission: RE | Admit: 2024-05-15 | Discharge: 2024-05-15 | Disposition: A | Discharge status: Home / Self Care | Source: Ambulatory Visit | Attending: Obstetrics | Admitting: Obstetrics

## 2024-05-15 VITALS — BP 128/83 | HR 80 | Ht 68.0 in | Wt 254.2 lb

## 2024-05-15 DIAGNOSIS — Z01419 Encounter for gynecological examination (general) (routine) without abnormal findings: Secondary | ICD-10-CM | POA: Insufficient documentation

## 2024-05-15 DIAGNOSIS — N898 Other specified noninflammatory disorders of vagina: Secondary | ICD-10-CM | POA: Diagnosis not present

## 2024-05-15 NOTE — Progress Notes (Signed)
 Subjective:        Patty Atkins is a 33 y.o. female here for a routine exam.  Current complaints: None.    Personal health questionnaire:  Is patient Ashkenazi Jewish, have a family history of breast and/or ovarian cancer: no Is there a family history of uterine cancer diagnosed at age < 4, gastrointestinal cancer, urinary tract cancer, family member who is a Personnel Officer syndrome-associated carrier: no Is the patient overweight and hypertensive, family history of diabetes, personal history of gestational diabetes, preeclampsia or PCOS: no Is patient over 70, have PCOS,  family history of premature CHD under age 57, diabetes, smoke, have hypertension or peripheral artery disease:  no At any time, has a partner hit, kicked or otherwise hurt or frightened you?: no Over the past 2 weeks, have you felt down, depressed or hopeless?: no Over the past 2 weeks, have you felt little interest or pleasure in doing things?:no   Gynecologic History No LMP recorded. Contraception: none Last Pap: 2018. Results were: normal Last mammogram: n/a. Results were: n/a  Obstetric History OB History  Gravida Para Term Preterm AB Living  3 2 2  1 2   SAB IAB Ectopic Multiple Live Births   1  0 2    # Outcome Date GA Lbr Len/2nd Weight Sex Type Anes PTL Lv  3 Term 08/25/17 [redacted]w[redacted]d / 00:44 8 lb 5.7 oz (3.79 kg) F Vag-Spont EPI  LIV     Birth Comments: NONE  2 IAB 08/03/16          1 Term 05/09/13 [redacted]w[redacted]d  8 lb 11 oz (3.941 kg) M Vag-Spont EPI  LIV    Past Medical History:  Diagnosis Date   Anemia    Vaginal Pap smear, abnormal     Past Surgical History:  Procedure Laterality Date   DILATION AND CURETTAGE OF UTERUS     HERNIA REPAIR       Current Outpatient Medications:    metFORMIN (GLUCOPHAGE-XR) 500 MG 24 hr tablet, Take 500 mg by mouth daily with breakfast., Disp: , Rfl:    Prenatal Vit-Fe Fumarate-FA (VITAFOL -OB) TABS, Take by mouth., Disp: , Rfl:    simvastatin (ZOCOR) 10 MG tablet, Take 10  mg by mouth at bedtime., Disp: , Rfl:    benzonatate  (TESSALON ) 100 MG capsule, Take 1 capsule (100 mg total) by mouth 3 (three) times daily as needed for cough. Do not take with alcohol or while driving or operating heavy machinery (Patient not taking: Reported on 05/15/2024), Disp: 21 capsule, Rfl: 0   Ferric Maltol  (ACCRUFER ) 30 MG CAPS, Take 1 capsule (30 mg total) by mouth 2 (two) times daily before a meal. Take 2 hrs before, or 2 hrs after a meal. (Patient not taking: Reported on 05/15/2024), Disp: 60 capsule, Rfl: 3   ferrous sulfate  (FEROSUL) 220 (44 Fe) MG/5ML solution, Take 5 mLs (220 mg total) by mouth 2 (two) times daily with a meal. (Patient not taking: Reported on 05/15/2024), Disp: 473 mL, Rfl: 0   metroNIDAZOLE  (FLAGYL ) 500 MG tablet, Take 1 tablet (500 mg total) by mouth 2 (two) times daily., Disp: 14 tablet, Rfl: 0   Prenat-Fe Poly-Methfol-FA-DHA (VITAFOL  ULTRA) 29-0.6-0.4-200 MG CAPS, Take 1 capsule by mouth daily before breakfast. (Patient not taking: Reported on 05/15/2024), Disp: 90 capsule, Rfl: 4   promethazine -dextromethorphan (PROMETHAZINE -DM) 6.25-15 MG/5ML syrup, Take 5 mLs by mouth at bedtime as needed for cough. Do not take with alcohol or while driving or operating heavy machinery (Patient not taking:  Reported on 10/13/2022), Disp: 118 mL, Rfl: 0 No Known Allergies  Social History   Tobacco Use   Smoking status: Former   Smokeless tobacco: Never  Substance Use Topics   Alcohol use: No    Family History  Problem Relation Age of Onset   Diabetes Maternal Grandmother       Review of Systems  Constitutional: negative for fatigue and weight loss Respiratory: negative for cough and wheezing Cardiovascular: negative for chest pain, fatigue and palpitations Gastrointestinal: negative for abdominal pain and change in bowel habits Musculoskeletal:negative for myalgias Neurological: negative for gait problems and tremors Behavioral/Psych: negative for abusive  relationship, depression Endocrine: negative for temperature intolerance    Genitourinary: positive for vaginal discharge.  negative for abnormal menstrual periods, genital lesions, hot flashes, sexual problems  Integument/breast: negative for breast lump, breast tenderness, nipple discharge and skin lesion(s)    Objective:       BP 128/83   Pulse 80   Ht 5' 8 (1.727 m)   Wt 254 lb 3.2 oz (115.3 kg)   BMI 38.65 kg/m  General:   Alert and no distress  Skin:   no rash or abnormalities  Lungs:   clear to auscultation bilaterally  Heart:   regular rate and rhythm, S1, S2 normal, no murmur, click, rub or gallop  Breasts:   normal without suspicious masses, skin or nipple changes or axillary nodes  Abdomen:  normal findings: no organomegaly, soft, non-tender and no hernia  Pelvis:  External genitalia: normal general appearance Urinary system: urethral meatus normal and bladder without fullness, nontender Vaginal: normal without tenderness, induration or masses Cervix: normal appearance Adnexa: normal bimanual exam Uterus: anteverted and non-tender, normal size   Lab Review Urine pregnancy test Labs reviewed yes Radiologic studies reviewed no  I have spent a total of 20 minutes of face-to-face time, excluding clinical staff time, reviewing notes and preparing to see patient, ordering tests and/or medications, and counseling the patient.   Assessment:    1. Encounter for gynecological examination with Papanicolaou smear of cervix (Primary) Rx: - Cytology - PAP( [place])  2. Vaginal discharge Rx: - Cervicovaginal ancillary only( [place]) - HIV antibody (with reflex) - RPR - Hepatitis C Antibody - Hepatitis B Surface AntiGEN    Plan:    Education reviewed: calcium supplements, depression evaluation, low fat, low cholesterol diet, safe sex/STD prevention, self breast exams, and weight bearing exercise. Follow up in: 1 year.    Orders Placed This Encounter   Procedures   HIV antibody (with reflex)   RPR   Hepatitis C Antibody   Hepatitis B Surface AntiGEN    CARLIN RONAL CENTERS, MD, FACOG Attending Obstetrician & Gynecologist, [facility] for [facility], [facility], [place] 05/15/2024

## 2024-05-15 NOTE — Progress Notes (Signed)
 Pt presents for AEX  LEEP 03/2022 Requesting STD testing

## 2024-05-16 LAB — HEPATITIS C ANTIBODY: Hep C Virus Ab: NONREACTIVE

## 2024-05-16 LAB — CERVICOVAGINAL ANCILLARY ONLY
Chlamydia: NEGATIVE
Comment: NEGATIVE
Comment: NEGATIVE
Comment: NORMAL
Neisseria Gonorrhea: NEGATIVE
Trichomonas: NEGATIVE

## 2024-05-16 LAB — HEPATITIS B SURFACE ANTIGEN: Hepatitis B Surface Ag: NEGATIVE

## 2024-05-16 LAB — RPR: RPR Ser Ql: NONREACTIVE

## 2024-05-16 LAB — HIV ANTIBODY (ROUTINE TESTING W REFLEX): HIV Screen 4th Generation wRfx: NONREACTIVE

## 2024-05-20 LAB — CYTOLOGY - PAP
Comment: NEGATIVE
Diagnosis: NEGATIVE
Diagnosis: REACTIVE
High risk HPV: NEGATIVE

## 2024-06-18 ENCOUNTER — Encounter: Payer: Self-pay | Admitting: Obstetrics
